# Patient Record
Sex: Female | Born: 1945 | Race: White | Hispanic: No | Marital: Single
Health system: Southern US, Community
[De-identification: ages and names within clinical notes are randomized; demographics above are authoritative.]

---

## 2004-10-10 ENCOUNTER — Ambulatory Visit: Payer: Self-pay | Admitting: Internal Medicine

## 2005-08-13 ENCOUNTER — Ambulatory Visit: Payer: Self-pay | Admitting: Internal Medicine

## 2005-09-15 ENCOUNTER — Ambulatory Visit: Payer: Self-pay | Admitting: Internal Medicine

## 2005-10-14 ENCOUNTER — Ambulatory Visit: Payer: Self-pay | Admitting: Internal Medicine

## 2006-08-12 ENCOUNTER — Ambulatory Visit: Payer: Self-pay | Admitting: Unknown Physician Specialty

## 2006-09-17 ENCOUNTER — Ambulatory Visit: Payer: Self-pay | Admitting: General Surgery

## 2006-10-28 ENCOUNTER — Ambulatory Visit: Payer: Self-pay | Admitting: Internal Medicine

## 2007-11-17 ENCOUNTER — Ambulatory Visit: Payer: Self-pay | Admitting: Internal Medicine

## 2007-11-23 ENCOUNTER — Ambulatory Visit: Payer: Self-pay | Admitting: Internal Medicine

## 2008-05-29 ENCOUNTER — Ambulatory Visit: Payer: Self-pay | Admitting: Internal Medicine

## 2008-12-06 ENCOUNTER — Ambulatory Visit: Payer: Self-pay | Admitting: Internal Medicine

## 2009-12-07 ENCOUNTER — Ambulatory Visit: Payer: Self-pay | Admitting: Internal Medicine

## 2011-01-14 ENCOUNTER — Ambulatory Visit: Payer: Self-pay | Admitting: Internal Medicine

## 2011-08-20 ENCOUNTER — Ambulatory Visit: Payer: Self-pay | Admitting: Unknown Physician Specialty

## 2011-09-26 ENCOUNTER — Ambulatory Visit: Payer: Self-pay | Admitting: Unknown Physician Specialty

## 2011-09-26 DIAGNOSIS — I10 Essential (primary) hypertension: Secondary | ICD-10-CM

## 2011-09-26 LAB — CBC
HCT: 35.8 % (ref 35.0–47.0)
HGB: 11.9 g/dL — ABNORMAL LOW (ref 12.0–16.0)
MCH: 31.4 pg (ref 26.0–34.0)
MCV: 95 fL (ref 80–100)
Platelet: 204 10*3/uL (ref 150–440)
RBC: 3.78 10*6/uL — ABNORMAL LOW (ref 3.80–5.20)

## 2011-09-26 LAB — APTT: Activated PTT: 28.8 secs (ref 23.6–35.9)

## 2011-09-26 LAB — URINALYSIS, COMPLETE
Bilirubin,UR: NEGATIVE
Blood: NEGATIVE
Glucose,UR: NEGATIVE mg/dL (ref 0–75)
Ketone: NEGATIVE
Nitrite: NEGATIVE
Protein: NEGATIVE
RBC,UR: 2 /HPF (ref 0–5)
WBC UR: 18 /HPF (ref 0–5)

## 2011-09-26 LAB — BASIC METABOLIC PANEL
Anion Gap: 10 (ref 7–16)
BUN: 22 mg/dL — ABNORMAL HIGH (ref 7–18)
Co2: 27 mmol/L (ref 21–32)
EGFR (African American): >60
Osmolality: 285 (ref 275–301)
Sodium: 141 mmol/L (ref 136–145)

## 2011-09-26 LAB — PROTIME-INR: Prothrombin Time: 13.2 secs (ref 11.5–14.7)

## 2011-10-08 ENCOUNTER — Inpatient Hospital Stay: Payer: Self-pay | Admitting: Unknown Physician Specialty

## 2011-10-08 LAB — ELECTROLYTE PANEL
Co2: 28 mmol/L (ref 21–32)
Potassium: 3.6 mmol/L (ref 3.5–5.1)
Sodium: 139 mmol/L (ref 136–145)

## 2011-10-08 LAB — HEMOGLOBIN: HGB: 10.4 g/dL — ABNORMAL LOW (ref 12.0–16.0)

## 2011-10-09 LAB — BASIC METABOLIC PANEL
Anion Gap: 6 — ABNORMAL LOW (ref 7–16)
BUN: 12 mg/dL (ref 7–18)
Calcium, Total: 8.2 mg/dL — ABNORMAL LOW (ref 8.5–10.1)
EGFR (African American): >60
EGFR (Non-African Amer.): >60
Glucose: 129 mg/dL — ABNORMAL HIGH (ref 65–99)
Potassium: 3.9 mmol/L (ref 3.5–5.1)
Sodium: 134 mmol/L — ABNORMAL LOW (ref 136–145)

## 2011-10-09 LAB — HEMOGLOBIN: HGB: 8.5 g/dL — ABNORMAL LOW (ref 12.0–16.0)

## 2011-11-05 ENCOUNTER — Ambulatory Visit: Payer: Self-pay | Admitting: Unknown Physician Specialty

## 2012-01-22 ENCOUNTER — Ambulatory Visit: Payer: Self-pay | Admitting: Internal Medicine

## 2012-09-06 ENCOUNTER — Ambulatory Visit: Payer: Self-pay | Admitting: Internal Medicine

## 2013-01-31 ENCOUNTER — Ambulatory Visit: Payer: Self-pay | Admitting: Internal Medicine

## 2013-04-27 IMAGING — MG MM CAD SCREENING MAMMO
1 series · 4 of 4 positions shown · non-contrast
Comparison: none

REASON FOR EXAM: SCR MAMMO NO ORDER
COMMENTS:

PROCEDURE:     MAM - MAM DGTL SCRN MAM NO ORDER W/CAD  - January 22, 2012  [DATE]
RESULT:     COMPARISON:  01/14/2011, 12/07/2009, 06/22/2002
TECHNIQUE: Digital screening mammograms were obtained. FDA approved
computer-aided detection (CAD) for mammography was utilized for this study.

[R CC · right · 4 of 4 slices shown]
[im 1/4]
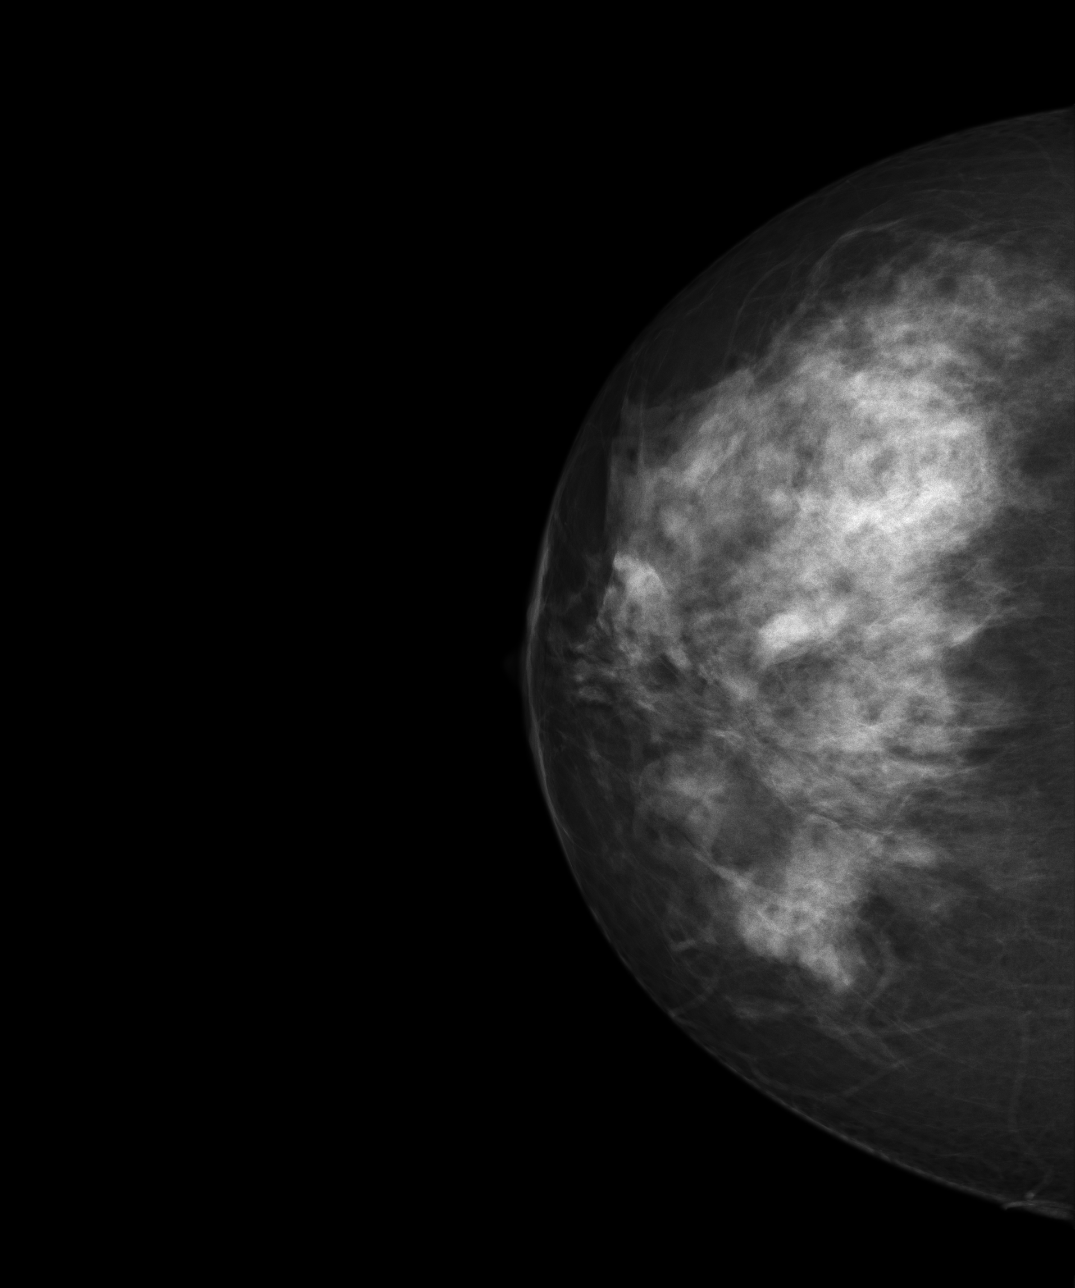
[im 2/4]
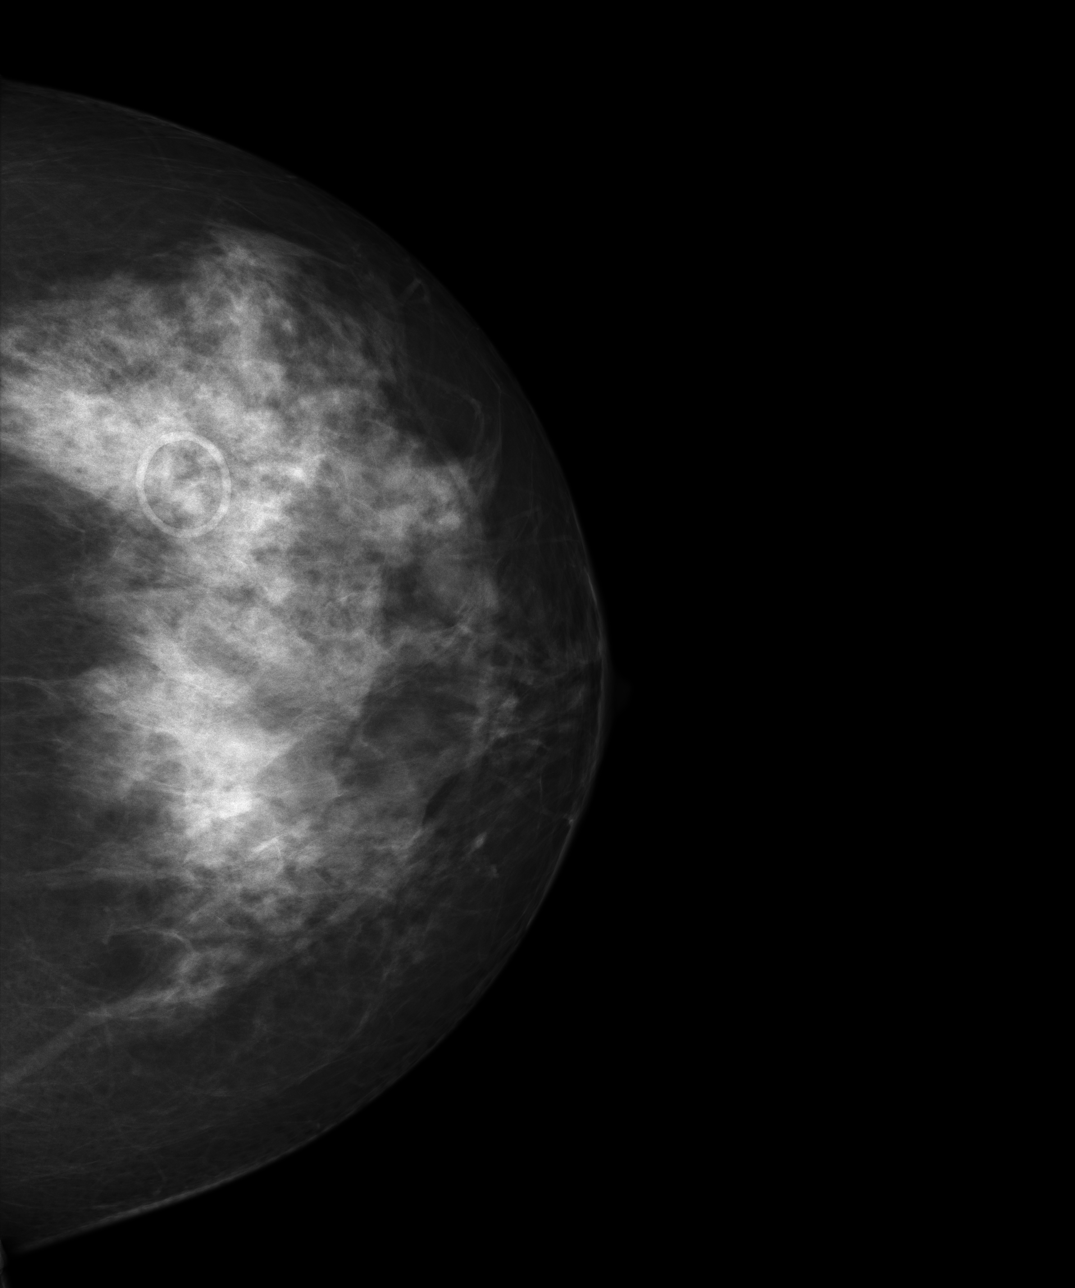
[im 3/4]
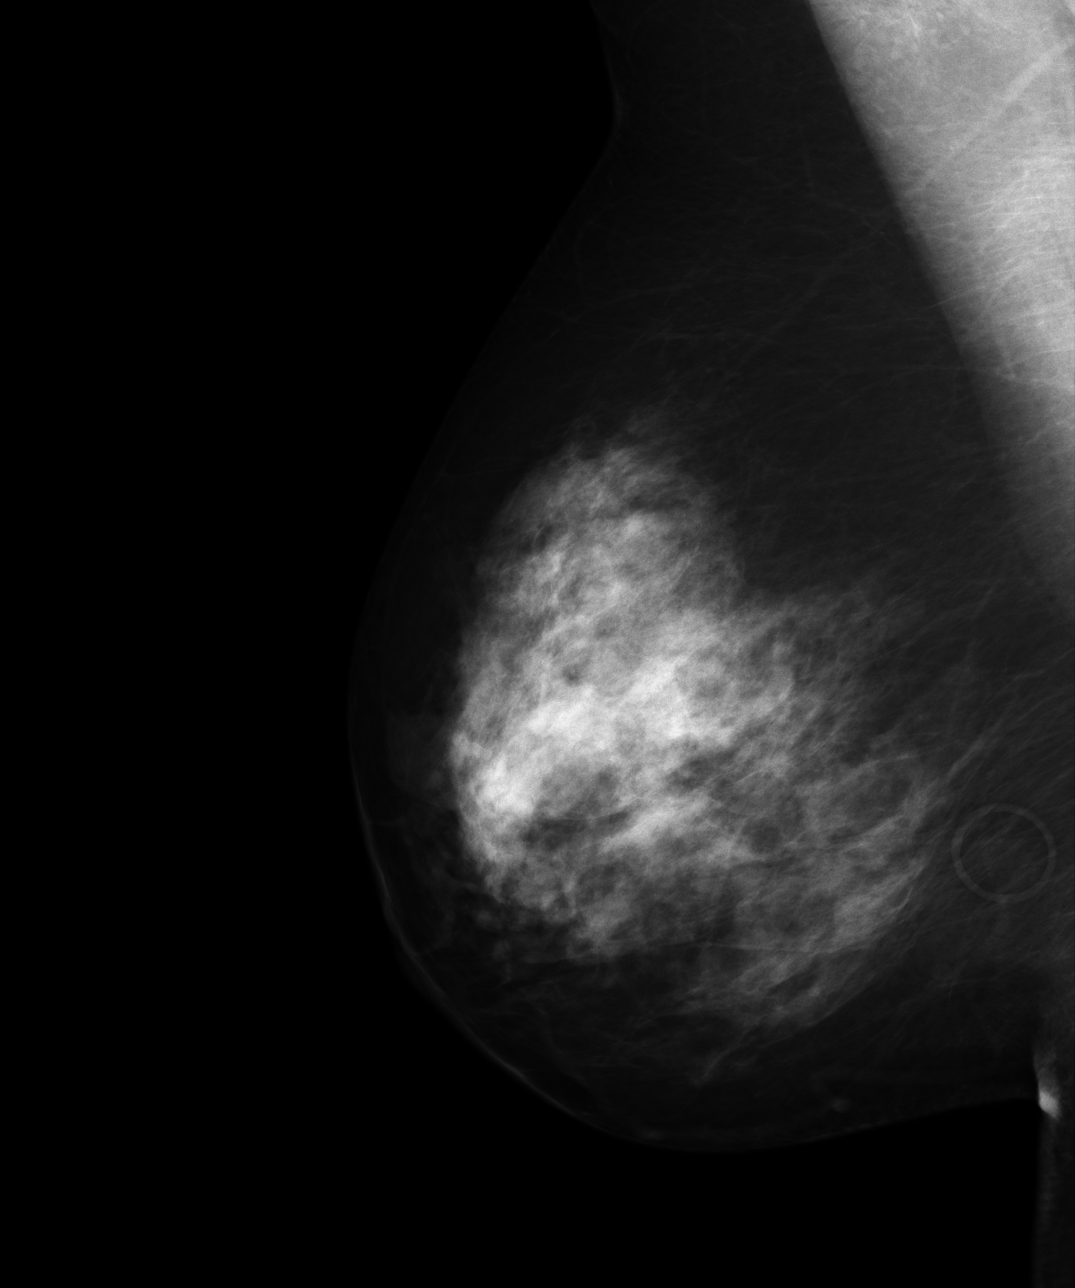
[im 4/4]
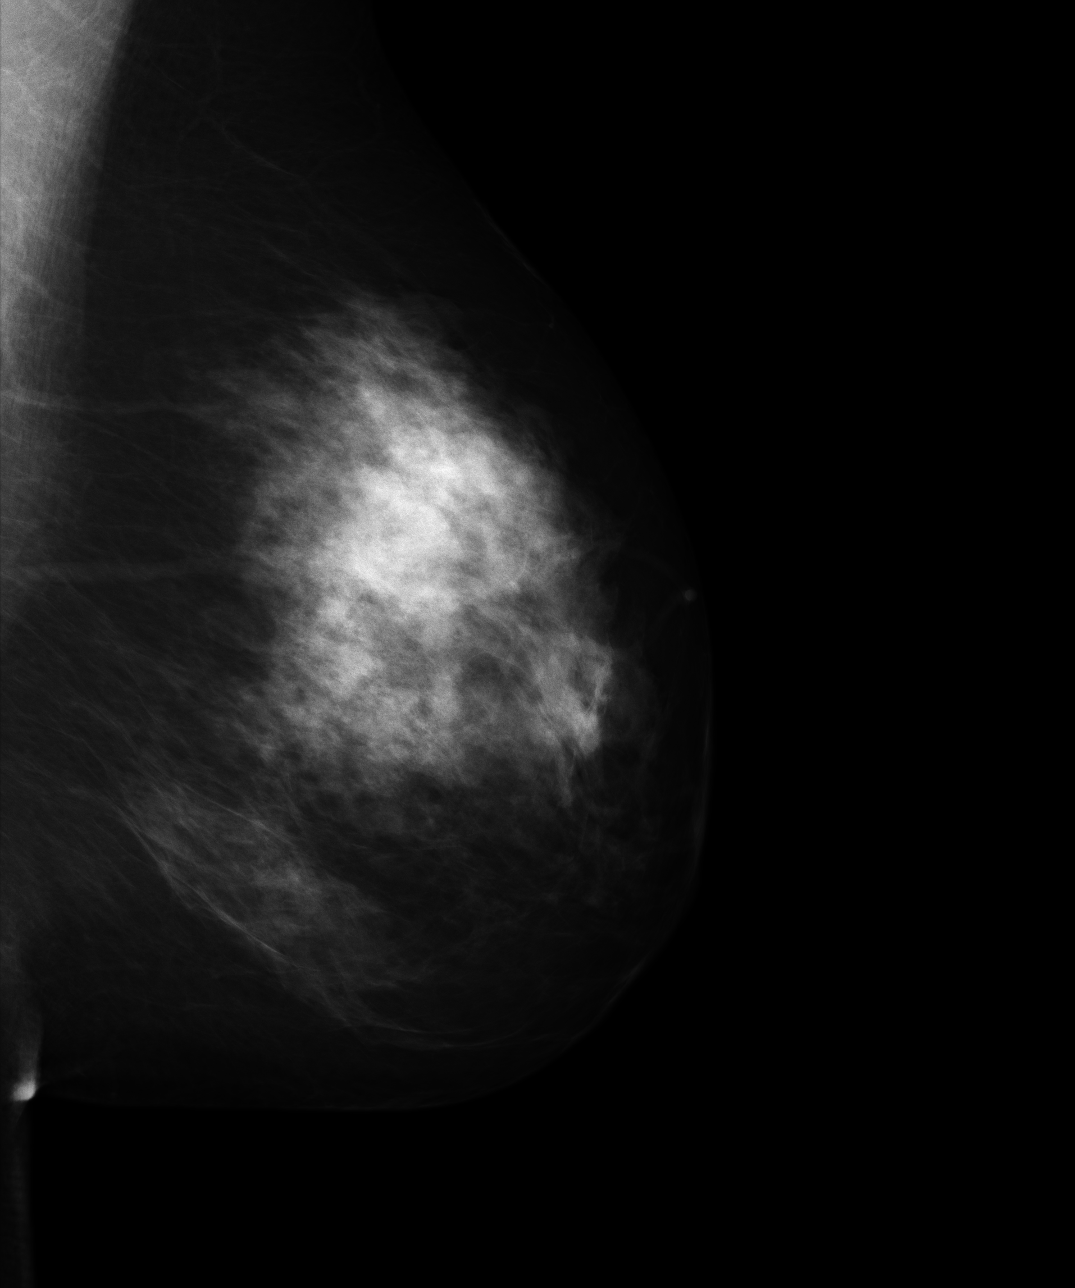

[4 of 4 positions shown; findings below may reference images not displayed]

FINDING: Bilateral breasts are heterogeneously dense which may lower the sensitivity
of mammography.  There is no dominant mass, architectural distortion or
clusters of suspicious microcalcifications.
IMPRESSION: 1.     Stable bilateral mammogram.
2.     Annual mammographic follow up recommended.
3.     BI-RADS:  Category 2- Benign.

A negative mammogram report does not preclude biopsy or other evaluation of
a clinically palpable or otherwise suspicious mass or lesion. Breast cancer
may not be detected by mammography in up to 10% of cases.

[REDACTED]

## 2014-03-14 ENCOUNTER — Ambulatory Visit: Payer: Self-pay | Admitting: Internal Medicine

## 2014-10-01 NOTE — Discharge Summary (Signed)
PATIENT NAME:  Natalie Gutierrez, Natalie Gutierrez MR#:  [id number] DATE OF BIRTH:  1946/01/07  DATE OF ADMISSION:  10/08/2011 DATE OF DISCHARGE:  10/12/2011  ADMITTING DIAGNOSIS: Severe degenerative arthritis right knee.   DISCHARGE DIAGNOSIS: Severe degenerative arthritis right knee.   PROCEDURE: Stryker PS total knee replacement on the right on 10/08/211.    SURGEON: Kathrene Alu., M.D.   ANESTHESIA: Femoral nerve block plus spinal.   HISTORY: Patient had a long history of degenerative arthritis of the right knee. She had failed conservative treatment in the form of intra-articular steroid injections. She also failed Synvisc injection. The patient was ultimately brought in for a total knee replacement due to her refractory to the above treatment.   PHYSICAL EXAMINATION: GENERAL: Well-developed, well-nourished female, no apparent distress. HEENT: Head normocephalic, atraumatic. She does wear glasses. Sclerae clear. Auditory acuity is good. Nares are patent. She has false teeth upper and lower. HEART: S1, S2, regular rate and rhythm. LUNGS: Posterior fields clear to auscultation. No rhonchi or crackles noted. RIGHT KNEE: Range of motion is roughly 15 to 105 degrees. Collateral ligaments are stable to stress testing. Her anterior skin is intact but a scar is seen from previous right knee surgery when she was a child. Homans sign is negative. Distally she is able to move her ankle quite well.   HOSPITAL COURSE: Patient was admitted to the hospital on 10/08/2011. The patient had surgery that same day. The patient had good pain control afterwards. She was nauseated, nausea medications were changed to Reglan. Patient did well with Reglan. The patient's labs remained stable. On postoperative day two hemoglobin trended up to 10.2. The patient received no transfusion. On 10/12/2011 the patient was stable and ready to go home with home health physical therapy.   CONDITION AT DISCHARGE: Stable.   DISPOSITION:  The patient was sent home with home physical therapy.   DISCHARGE INSTRUCTIONS:  1. Patient may gradually increase weight bearing on the affected extremity. Elevate the affected foot/leg on 1 or 2 pillows with the foot higher than the knee. Thigh-high TED hose on both legs and remove at bedtime, replace on arising the next morning.  2. Patient may resume a regular diet as tolerated. No concentrated sweets or sugar. Multivitamin once a day.  3. Resume typical home medications: Celebrex 200 mg twice a day, Tylenol 650 to 1000 mg every six hours as needed for pain, Ultram 1 to 2 tablets every six hours as needed for pain.  4. Continue using the Polar Care unit, maintaining the temperature between 40 and 50 degrees Fahrenheit.  5. Do not get the dressing, bandage wet or dirty. Call [facility] orthopedics if the dressing gets water under it. Leave the dressing on.  6. Patient should call [facility] orthopedics if there is any bright red bleeding from the incision wound, fever above 101.5 degrees, redness swelling or drainage at the incision site. Patient should also call [facility] orthopedics if develop any pain, numbness or weakness in the legs, or bowel or bladder symptoms.  7. Patient is to call [facility] orthopedics for follow-up appointment in two weeks.   DISCHARGE MEDICATIONS: Patient is to continue with her home medications and she will also take:  1. Tylenol [phone] mg every six hours as needed for pain. 2. Ultram 1 to 2 tablets every six hours as needed for pain. 3. Celebrex 200 mg twice a day.  ____________________________ Duanne Guess, PA-C tcg:cms D: 10/14/2011 13:08:41 ET T: 10/15/2011 10:14:04 ET  JOB#: [id number]  cc: Duanne Guess, PA-C, <Dictator> Duanne Guess [place] ELECTRONICALLY SIGNED 10/18/2011 2:49

## 2014-10-01 NOTE — Op Note (Signed)
PATIENT NAME:  Natalie Gutierrez, Natalie Gutierrez MR#:  [id number] DATE OF BIRTH:  [birth year]  DATE OF PROCEDURE:  11/05/2011  PREOPERATIVE DIAGNOSIS: Status post right total knee replacement with arthrofibrosis.   POSTOPERATIVE DIAGNOSIS: Status post right total knee replacement with arthrofibrosis.   PROCEDURE PERFORMED: Manipulation right knee under anesthesia.   SURGEON: Kathrene Alu., MD  ANESTHESIA: General.   HISTORY: The patient had a total knee replacement about 3-4 weeks prior to her manipulation. She had progressed slowly in physical therapy. She had about 20 degrees flexion deformity with further active and passive flexion about 95 degrees. Because of the patient's poor progress with physical therapy she was brought in for manipulation of her right knee under anesthesia.   DESCRIPTION OF PROCEDURE: Patient taken to the Operating Room where satisfactory general anesthesia was achieved. I went ahead and manipulated the right knee into almost full extension and was able to achieve about 105 to 110 degrees of flexion.   At the conclusion of the manipulation the patient was awakened. Ice pack was applied to her right knee and then she was transferred to her stretcher bed and taken to the recovery room in satisfactory condition.   ____________________________ Kathrene Alu., MD hbk:cms D: 11/05/2011 09:16:48 ET T: 11/05/2011 12:15:38 ET JOB#: [id number]  cc: Kathrene Alu., MD, <Dictator> Kathrene Alu MD ELECTRONICALLY SIGNED 11/17/2011 18:57

## 2014-10-01 NOTE — Op Note (Signed)
PATIENT NAME:  Natalie Gutierrez, Natalie Gutierrez MR#:  [id number] DATE OF BIRTH:  [birth year]  DATE OF PROCEDURE:  10/08/2011  PROCEDURE: Right femoral nerve block.   INDICATION: To help this patient with postoperative pain about to have a right total knee arthroplasty by Dr. Leanor Kail.   ATTENDING: Felix Meras P. Phineas Douglas, MD   DESCRIPTION OF PROCEDURE: The risks and benefits of spinal anesthesia, general anesthesia, and the femoral nerve block were discussed with the patient and her family in Same Day Surgery preoperatively. She elected to proceed with the spinal anesthetic with the right femoral nerve block. She does realize that if the surgery takes a prolonged period of time and her block is wearing off that she may need to have general anesthesia as well. Consent was obtained.   The patient was brought to the PAC-U preoperatively. The usual monitors were applied and she was placed on nasal cannula oxygen. She was lightly sedated with a total of 3 mg of Versed intravenously. She was still awake, talkative, and in good verbal communication with [place] but much more comfortable. She had a Betadine prep of her right upper anterior thigh and groin area x3. A sterile technique was used. A 22-gauge 2-inch Stimuplex needle with 0.7 mA of output was used. The usual landmarks were used. The needle was advanced approximately 3/4-inch lateral to the right femoral pulsation. There was good right thigh movement. There was no heme or paresthesias. She had a total of 30 mL of 0.25% bupivacaine with 1:400,000 of epinephrine injected incrementally. She tolerated the block without problem or complication. I am hopeful that the block will help her significantly with postoperative pain for the duration of the block.   ____________________________ Werner Lean. Phineas Douglas, MD spp:drc D: 10/08/2011 07:40:43 ET T: 10/08/2011 08:15:07 ET JOB#: [id number]  cc: Nicki Reaper P. Phineas Douglas, MD, <Dictator> Lucilla Edin MD ELECTRONICALLY SIGNED 10/08/2011 8:51

## 2014-10-01 NOTE — Op Note (Signed)
PATIENT NAME:  Natalie Gutierrez, Natalie Gutierrez MR#:  [id number] DATE OF BIRTH:  Nov 27, 1945  DATE OF PROCEDURE:  10/08/2011  PREOPERATIVE DIAGNOSIS: Severe degenerative arthritis, right knee.   POSTOPERATIVE DIAGNOSIS: Severe degenerative arthritis, right knee.   OPERATION: Stryker PS total knee replacement on the right.   SURGEON: Kathrene Alu., MD   ANESTHESIA: Femoral nerve block plus spinal.   HISTORY: The patient had a long history of degenerative arthritis of the right knee. She had failed conservative treatment in the form of intra-articular steroid injections and Synvisc One injection. The patient was ultimately brought in for a total knee replacement due to her failure to respond to the above treatment.   DESCRIPTION OF PROCEDURE: The patient was taken to the preoperative area where satisfactory femoral nerve block was achieved, and then she was taken the Operating Room where satisfactory general anesthesia was achieved. A Foley was inserted. A tourniquet was applied to her right upper thigh, and the right lower extremity was prepped and draped in the usual fashion for a procedure about the knee. The patient, incidentally, was given 2 grams of Kefzol IV prior to the start of the procedure. The right lower extremity was then exsanguinated and the tourniquet was inflated.   A slightly curved longitudinal incision was made over the anterior aspect of the right knee joint. Dissection was carried down through the subcutaneous tissue onto the extensor mechanism. The vastus medialis was divided about a fingerbreadth above the superomedial border of the patella. It was divided in line with its fibers, and dissection was carried along the medial border of the patellar and the medial border of the patellar tendon. The patella was then everted and dislocated laterally. Inspection of the joint revealed the patient had rather significant degenerative arthritis of her knee that involved both condyles. There  were significant osteophytes about the distal femur and in the intercondylar notch and around the patella. The osteophytes were removed. I went ahead and made a 3/8th inch hole in the trochlear groove about a centimeter anterior to the posterior cruciate ligament insertion on the distal femur. An intramedullary rod was inserted. On the distal end of the rod was  the alignment jig along with the cutting block. After the alignment jig was put in position, the cutting block was affixed to the distal femur with smooth pins. The intramedullary rod and alignment jig were removed. We did use a +2 setting on the femur to remove a little extra bone because the patient did have a flexion deformity.   After the distal femoral cut was made, we templated the distal femur for a #7 femoral component. The appropriate starting holes for a #7 cutting block were made. They were placed in slight exit external rotation. A #7 cutting block was impacted onto the distal femur, and then anterior and posterior cuts were made along with the anterior and posterior chamfer cuts. I notched out the intercondylar area using the appropriate jig. The anterior cruciate ligament and posterior cruciate ligament were removed at this time.   The knee was hyperflexed. Meniscal remnants were excised. A 3/8-inch hole was made in the midline of the proximal tibia at the junction of the anterior one third and posterior two thirds. The intramedullary rod was inserted. On the proximal end of the rod was a 0-degree sloped cutting block. We set the cutting block for a 4 mm cut below the lowest portion of her her medial tibial plateau. The cutting block was aligned and affixed to  the proximal tibia with smooth pins. The intramedullary rod and stylus were removed. The proximal tibial cut was made without difficulty.   At this time, I did use an elevator to slightly elevate the posterior capsule from its attachment to the distal femur. I then inserted the  trial components. I used a #5 standard tibial tray along with the #7 femoral component and a 10 mm polyethylene spacer. With these components in place, the knee came to full extension and seemed to flex well. It seemed to be stable.   I went ahead and released the tourniquet at this time. It was up a little more than 60 minutes. While the tourniquet was down, I went ahead and removed about 10 mm of bone from the retropatellar surface. We templated the retropatellar surface for a #5 resurfacing patellar component. We used a 10 mm thick trial. We made holes for the trial. With the trial in place, the patella seemed to track well.   After the tourniquet had been down about 10 minutes, the right lower extremity was re- exsanguinated and the tourniquet was inflated. The trial components were removed except for the tibial baseplate. We used the tibial baseplate as a guide for our cruciate cut in the proximal tibia. After the cruciate cut was made, we used jet lavage to remove blood from the cut cancellous surfaces. The permanent components were impacted.  The #5 tibial baseplate was impacted onto the proximal tibia with methylmethacrylate. The #7 PS femoral component was impacted onto the distal femur with methylmethacrylate. Excess glue was removed. Then I inserted a 10 mm trial spacer and extended the knee. The #5 10 mm resurfacing patellar component was glued to the retropatellar surface. Again, excess glue was removed.  After the glue had set up, I removed the trial 10 mm tibial bearing insert and inserted the permanent #5 10-mm PS tibial-bearing insert onto the tibial baseplate. Again the knee moved well and seemed to be stable. The tourniquet was released. It was up about 54 minutes the second time.   Bleeding was controlled with coagulation cautery. The wound was irrigated with GU irrigant. Two Autovac tubes were inserted in the depths of the wound. The extensor mechanism was closed with #1 Ethibond and #1  Vicryl sutures. The subcutaneous was closed with 2-0 Vicryl and the skin with skin staples. Betadine was applied to the wound followed by a sterile dressing. I did apply four TENS pads about the incision. A Polar Care cooling pad was applied around the right knee, and a knee immobilizer was applied over this.   The patient was then transferred to a stretcher bed and taken to the recovery room in satisfactory condition.   ESTIMATED BLOOD LOSS: About 250 mL.   ____________________________ Kathrene Alu., MD hbk:cbb D: 10/11/2011 14:38:58 ET T: 10/11/2011 15:09:26 ET JOB#: [id number]  cc: Kathrene Alu., MD, <Dictator> Vilinda Flake, Brooke Bonito MD ELECTRONICALLY SIGNED 10/29/2011 18:54

## 2015-03-07 ENCOUNTER — Other Ambulatory Visit: Payer: Self-pay | Admitting: Internal Medicine

## 2015-03-07 DIAGNOSIS — Z1231 Encounter for screening mammogram for malignant neoplasm of breast: Secondary | ICD-10-CM

## 2015-03-16 ENCOUNTER — Ambulatory Visit
Admission: RE | Admit: 2015-03-16 | Discharge: 2015-03-16 | Disposition: A | Discharge status: Home / Self Care | Payer: Medicare Other | Source: Ambulatory Visit | Attending: Internal Medicine | Admitting: Internal Medicine

## 2015-03-16 DIAGNOSIS — Z1231 Encounter for screening mammogram for malignant neoplasm of breast: Secondary | ICD-10-CM | POA: Diagnosis present

## 2015-06-25 ENCOUNTER — Other Ambulatory Visit: Payer: Self-pay | Admitting: Rheumatology

## 2015-06-25 DIAGNOSIS — M79605 Pain in left leg: Principal | ICD-10-CM

## 2015-06-25 DIAGNOSIS — M79604 Pain in right leg: Secondary | ICD-10-CM

## 2015-07-13 ENCOUNTER — Ambulatory Visit
Admission: RE | Admit: 2015-07-13 | Discharge: 2015-07-13 | Disposition: A | Discharge status: Home / Self Care | Payer: Medicare Other | Source: Ambulatory Visit | Attending: Rheumatology | Admitting: Rheumatology

## 2015-07-13 DIAGNOSIS — M5116 Intervertebral disc disorders with radiculopathy, lumbar region: Secondary | ICD-10-CM | POA: Insufficient documentation

## 2015-07-13 DIAGNOSIS — M79604 Pain in right leg: Secondary | ICD-10-CM

## 2015-07-13 DIAGNOSIS — D1803 Hemangioma of intra-abdominal structures: Secondary | ICD-10-CM | POA: Diagnosis not present

## 2015-07-13 DIAGNOSIS — M79605 Pain in left leg: Secondary | ICD-10-CM | POA: Insufficient documentation

## 2015-07-13 DIAGNOSIS — M545 Low back pain: Secondary | ICD-10-CM | POA: Diagnosis not present

## 2016-03-20 ENCOUNTER — Other Ambulatory Visit: Payer: Self-pay | Admitting: Neurology

## 2016-03-20 DIAGNOSIS — R29810 Facial weakness: Secondary | ICD-10-CM

## 2016-03-20 DIAGNOSIS — R251 Tremor, unspecified: Secondary | ICD-10-CM

## 2016-03-27 ENCOUNTER — Other Ambulatory Visit: Payer: Self-pay | Admitting: Neurology

## 2016-03-31 ENCOUNTER — Other Ambulatory Visit: Payer: Self-pay | Admitting: Internal Medicine

## 2016-03-31 DIAGNOSIS — Z1231 Encounter for screening mammogram for malignant neoplasm of breast: Secondary | ICD-10-CM

## 2016-04-02 ENCOUNTER — Ambulatory Visit: Payer: Medicare Other

## 2016-04-04 ENCOUNTER — Ambulatory Visit
Admission: RE | Admit: 2016-04-04 | Discharge: 2016-04-04 | Disposition: A | Discharge status: Home / Self Care | Payer: Medicare Other | Source: Ambulatory Visit | Attending: Neurology | Admitting: Neurology

## 2016-04-04 DIAGNOSIS — R29810 Facial weakness: Secondary | ICD-10-CM | POA: Insufficient documentation

## 2016-04-04 DIAGNOSIS — R600 Localized edema: Secondary | ICD-10-CM | POA: Diagnosis not present

## 2016-04-04 DIAGNOSIS — R251 Tremor, unspecified: Secondary | ICD-10-CM

## 2016-04-25 ENCOUNTER — Ambulatory Visit
Admission: RE | Admit: 2016-04-25 | Discharge: 2016-04-25 | Disposition: A | Discharge status: Home / Self Care | Payer: Medicare Other | Source: Ambulatory Visit | Attending: Internal Medicine | Admitting: Internal Medicine

## 2016-04-25 DIAGNOSIS — Z1231 Encounter for screening mammogram for malignant neoplasm of breast: Secondary | ICD-10-CM | POA: Diagnosis present

## 2017-03-04 ENCOUNTER — Other Ambulatory Visit: Payer: Self-pay | Admitting: Internal Medicine

## 2017-03-04 DIAGNOSIS — Z1231 Encounter for screening mammogram for malignant neoplasm of breast: Secondary | ICD-10-CM

## 2017-05-08 ENCOUNTER — Ambulatory Visit
Admission: RE | Admit: 2017-05-08 | Discharge: 2017-05-08 | Disposition: A | Discharge status: Home / Self Care | Payer: Medicare Other | Source: Ambulatory Visit | Attending: Internal Medicine | Admitting: Internal Medicine

## 2017-05-08 DIAGNOSIS — Z1231 Encounter for screening mammogram for malignant neoplasm of breast: Secondary | ICD-10-CM | POA: Insufficient documentation

## 2018-01-07 ENCOUNTER — Other Ambulatory Visit: Payer: Self-pay | Admitting: Internal Medicine

## 2018-01-07 DIAGNOSIS — Z1231 Encounter for screening mammogram for malignant neoplasm of breast: Secondary | ICD-10-CM

## 2018-05-14 ENCOUNTER — Ambulatory Visit
Admission: RE | Admit: 2018-05-14 | Discharge: 2018-05-14 | Disposition: A | Discharge status: Home / Self Care | Payer: Medicare Other | Source: Ambulatory Visit | Attending: Internal Medicine | Admitting: Internal Medicine

## 2018-05-14 DIAGNOSIS — Z1231 Encounter for screening mammogram for malignant neoplasm of breast: Secondary | ICD-10-CM | POA: Insufficient documentation

## 2018-06-17 DIAGNOSIS — M5136 Other intervertebral disc degeneration, lumbar region: Secondary | ICD-10-CM | POA: Diagnosis not present

## 2018-06-17 DIAGNOSIS — M9903 Segmental and somatic dysfunction of lumbar region: Secondary | ICD-10-CM | POA: Diagnosis not present

## 2018-06-17 DIAGNOSIS — M5412 Radiculopathy, cervical region: Secondary | ICD-10-CM | POA: Diagnosis not present

## 2018-06-17 DIAGNOSIS — M9901 Segmental and somatic dysfunction of cervical region: Secondary | ICD-10-CM | POA: Diagnosis not present

## 2018-06-25 DIAGNOSIS — I10 Essential (primary) hypertension: Secondary | ICD-10-CM | POA: Diagnosis not present

## 2018-07-02 DIAGNOSIS — E039 Hypothyroidism, unspecified: Secondary | ICD-10-CM | POA: Diagnosis not present

## 2018-07-02 DIAGNOSIS — E559 Vitamin D deficiency, unspecified: Secondary | ICD-10-CM | POA: Diagnosis not present

## 2018-07-02 DIAGNOSIS — E7800 Pure hypercholesterolemia, unspecified: Secondary | ICD-10-CM | POA: Diagnosis not present

## 2018-07-02 DIAGNOSIS — I10 Essential (primary) hypertension: Secondary | ICD-10-CM | POA: Diagnosis not present

## 2018-07-02 DIAGNOSIS — E538 Deficiency of other specified B group vitamins: Secondary | ICD-10-CM | POA: Diagnosis not present

## 2018-07-02 DIAGNOSIS — R05 Cough: Secondary | ICD-10-CM | POA: Diagnosis not present

## 2018-07-02 DIAGNOSIS — N183 Chronic kidney disease, stage 3 (moderate): Secondary | ICD-10-CM | POA: Diagnosis not present

## 2018-07-02 DIAGNOSIS — Z0000 Encounter for general adult medical examination without abnormal findings: Secondary | ICD-10-CM | POA: Diagnosis not present

## 2018-07-06 ENCOUNTER — Other Ambulatory Visit: Payer: Self-pay | Admitting: Internal Medicine

## 2018-07-06 DIAGNOSIS — N183 Chronic kidney disease, stage 3 unspecified: Secondary | ICD-10-CM

## 2018-07-09 ENCOUNTER — Ambulatory Visit
Admission: RE | Admit: 2018-07-09 | Discharge: 2018-07-09 | Disposition: A | Discharge status: Home / Self Care | Payer: PPO | Source: Ambulatory Visit | Attending: Internal Medicine | Admitting: Internal Medicine

## 2018-07-09 DIAGNOSIS — N183 Chronic kidney disease, stage 3 unspecified: Secondary | ICD-10-CM

## 2018-07-15 DIAGNOSIS — M9901 Segmental and somatic dysfunction of cervical region: Secondary | ICD-10-CM | POA: Diagnosis not present

## 2018-07-15 DIAGNOSIS — M9903 Segmental and somatic dysfunction of lumbar region: Secondary | ICD-10-CM | POA: Diagnosis not present

## 2018-07-15 DIAGNOSIS — M5412 Radiculopathy, cervical region: Secondary | ICD-10-CM | POA: Diagnosis not present

## 2018-07-15 DIAGNOSIS — M5136 Other intervertebral disc degeneration, lumbar region: Secondary | ICD-10-CM | POA: Diagnosis not present

## 2018-08-12 DIAGNOSIS — M5412 Radiculopathy, cervical region: Secondary | ICD-10-CM | POA: Diagnosis not present

## 2018-08-12 DIAGNOSIS — M9901 Segmental and somatic dysfunction of cervical region: Secondary | ICD-10-CM | POA: Diagnosis not present

## 2018-08-12 DIAGNOSIS — M5136 Other intervertebral disc degeneration, lumbar region: Secondary | ICD-10-CM | POA: Diagnosis not present

## 2018-08-12 DIAGNOSIS — M9903 Segmental and somatic dysfunction of lumbar region: Secondary | ICD-10-CM | POA: Diagnosis not present

## 2018-08-23 ENCOUNTER — Encounter: Payer: Self-pay | Admitting: *Deleted

## 2018-10-01 DIAGNOSIS — Z79899 Other long term (current) drug therapy: Secondary | ICD-10-CM | POA: Diagnosis not present

## 2018-10-01 DIAGNOSIS — Z1211 Encounter for screening for malignant neoplasm of colon: Secondary | ICD-10-CM | POA: Diagnosis not present

## 2018-10-01 DIAGNOSIS — R8290 Unspecified abnormal findings in urine: Secondary | ICD-10-CM | POA: Diagnosis not present

## 2018-10-01 DIAGNOSIS — E559 Vitamin D deficiency, unspecified: Secondary | ICD-10-CM | POA: Diagnosis not present

## 2018-10-01 DIAGNOSIS — I10 Essential (primary) hypertension: Secondary | ICD-10-CM | POA: Diagnosis not present

## 2018-10-01 DIAGNOSIS — E039 Hypothyroidism, unspecified: Secondary | ICD-10-CM | POA: Diagnosis not present

## 2018-10-01 DIAGNOSIS — E538 Deficiency of other specified B group vitamins: Secondary | ICD-10-CM | POA: Diagnosis not present

## 2018-10-01 DIAGNOSIS — N183 Chronic kidney disease, stage 3 (moderate): Secondary | ICD-10-CM | POA: Diagnosis not present

## 2018-10-01 DIAGNOSIS — E7800 Pure hypercholesterolemia, unspecified: Secondary | ICD-10-CM | POA: Diagnosis not present

## 2018-10-06 DIAGNOSIS — Z1211 Encounter for screening for malignant neoplasm of colon: Secondary | ICD-10-CM | POA: Diagnosis not present

## 2018-10-27 ENCOUNTER — Other Ambulatory Visit: Payer: Self-pay | Admitting: *Deleted

## 2018-10-27 NOTE — Patient Outreach (Signed)
[place] [facility]) Care Management  10/27/2018  Natalie Gutierrez 01/21/1946 [id number]   HTA HRA Telephone Screen     Outreach Attempt:  Successful telephone outreach to patient in the month of March to complete Health Risk Assessment Screening.  HIPAA verified with patient.  Patient reporting history of hypertension and stating blood pressures are well controlled at medical appointments.  Does not monitor blood pressure at home.  Does not have advance directive and requesting information to create one.  Denies any further needs, at this time.   Plan:  Patient will be placed in a [place] with 3 month follow up to verify receipt of Advance Directive information.  RN Health Coach will send [facility] Welcome Letter. RN Health Coach sent Advance Directive Packet and EMMI Advance Directive.  RN Health Coach will make next telephone outreach to patient for follow up in the month of June.  [place] [phone] [email or web address]

## 2018-11-18 ENCOUNTER — Other Ambulatory Visit: Payer: Self-pay | Admitting: *Deleted

## 2018-11-18 NOTE — Patient Outreach (Signed)
[place] [facility]) Care Management  11/18/2018  Brieanna Nau [birth year] [id number]   HTA HRAFollow Up   Outreach Attempt:  Successful telephone outreach to patient for follow up.  HIPAA verified with patient.  Patient verbalizes she has received Advance Directive packet and her daughter is helping her with the packet.  States she understands not to sign paperwork until she is in front of notary.  Patient states she feels her blood pressure is still well controlled. Discussed with patient transition to [facility].  Plan: RN Health Coach will outreach to patient in 6 months if patient has not transitioned to [facility].  [place] [phone] [email or web address]

## 2018-12-01 ENCOUNTER — Other Ambulatory Visit: Payer: Self-pay | Admitting: *Deleted

## 2018-12-01 NOTE — Patient Outreach (Signed)
[place] [facility]) Care Management  12/01/2018  Yulisa Chirico 02-03-1946 [id number]   Case Closure/Transition to [facility]/CCI   Outreach:  Patient case has been transitioned to [facility]/CCI for further Care Management assistance.  Plan: RN Health Coach will close case at this time. RN Health Coach will send primary care provider Care Management Case Closure Letter. RN Health Coach will make patient inactive with [facility] Care Management at this time.  [place] [phone] [email or web address]

## 2018-12-31 DIAGNOSIS — R8290 Unspecified abnormal findings in urine: Secondary | ICD-10-CM | POA: Diagnosis not present

## 2018-12-31 DIAGNOSIS — E039 Hypothyroidism, unspecified: Secondary | ICD-10-CM | POA: Diagnosis not present

## 2018-12-31 DIAGNOSIS — Z79899 Other long term (current) drug therapy: Secondary | ICD-10-CM | POA: Diagnosis not present

## 2018-12-31 DIAGNOSIS — N183 Chronic kidney disease, stage 3 (moderate): Secondary | ICD-10-CM | POA: Diagnosis not present

## 2018-12-31 DIAGNOSIS — I10 Essential (primary) hypertension: Secondary | ICD-10-CM | POA: Diagnosis not present

## 2018-12-31 DIAGNOSIS — R0600 Dyspnea, unspecified: Secondary | ICD-10-CM | POA: Diagnosis not present

## 2018-12-31 DIAGNOSIS — E559 Vitamin D deficiency, unspecified: Secondary | ICD-10-CM | POA: Diagnosis not present

## 2018-12-31 DIAGNOSIS — E7800 Pure hypercholesterolemia, unspecified: Secondary | ICD-10-CM | POA: Diagnosis not present

## 2018-12-31 DIAGNOSIS — R7309 Other abnormal glucose: Secondary | ICD-10-CM | POA: Diagnosis not present

## 2019-01-13 DIAGNOSIS — R0600 Dyspnea, unspecified: Secondary | ICD-10-CM | POA: Diagnosis not present

## 2019-01-13 DIAGNOSIS — I10 Essential (primary) hypertension: Secondary | ICD-10-CM | POA: Diagnosis not present

## 2019-04-05 DIAGNOSIS — N1832 Chronic kidney disease, stage 3b: Secondary | ICD-10-CM | POA: Diagnosis not present

## 2019-04-05 DIAGNOSIS — E559 Vitamin D deficiency, unspecified: Secondary | ICD-10-CM | POA: Diagnosis not present

## 2019-04-05 DIAGNOSIS — E7800 Pure hypercholesterolemia, unspecified: Secondary | ICD-10-CM | POA: Diagnosis not present

## 2019-04-05 DIAGNOSIS — Z1231 Encounter for screening mammogram for malignant neoplasm of breast: Secondary | ICD-10-CM | POA: Diagnosis not present

## 2019-04-05 DIAGNOSIS — I10 Essential (primary) hypertension: Secondary | ICD-10-CM | POA: Diagnosis not present

## 2019-04-05 DIAGNOSIS — E039 Hypothyroidism, unspecified: Secondary | ICD-10-CM | POA: Diagnosis not present

## 2019-04-05 DIAGNOSIS — E538 Deficiency of other specified B group vitamins: Secondary | ICD-10-CM | POA: Diagnosis not present

## 2019-04-05 DIAGNOSIS — Z79899 Other long term (current) drug therapy: Secondary | ICD-10-CM | POA: Diagnosis not present

## 2019-04-05 DIAGNOSIS — R7309 Other abnormal glucose: Secondary | ICD-10-CM | POA: Diagnosis not present

## 2019-04-07 ENCOUNTER — Other Ambulatory Visit: Payer: Self-pay | Admitting: Internal Medicine

## 2019-04-07 DIAGNOSIS — Z1231 Encounter for screening mammogram for malignant neoplasm of breast: Secondary | ICD-10-CM

## 2019-04-13 DIAGNOSIS — R8290 Unspecified abnormal findings in urine: Secondary | ICD-10-CM | POA: Diagnosis not present

## 2019-04-13 DIAGNOSIS — R300 Dysuria: Secondary | ICD-10-CM | POA: Diagnosis not present

## 2019-05-16 ENCOUNTER — Ambulatory Visit
Admission: RE | Admit: 2019-05-16 | Discharge: 2019-05-16 | Disposition: A | Discharge status: Home / Self Care | Payer: PPO | Source: Ambulatory Visit | Attending: Internal Medicine | Admitting: Internal Medicine

## 2019-05-16 DIAGNOSIS — Z1231 Encounter for screening mammogram for malignant neoplasm of breast: Secondary | ICD-10-CM | POA: Insufficient documentation

## 2019-09-02 DIAGNOSIS — Z1211 Encounter for screening for malignant neoplasm of colon: Secondary | ICD-10-CM | POA: Diagnosis not present

## 2019-09-02 DIAGNOSIS — I471 Supraventricular tachycardia: Secondary | ICD-10-CM | POA: Diagnosis not present

## 2019-09-02 DIAGNOSIS — R7309 Other abnormal glucose: Secondary | ICD-10-CM | POA: Diagnosis not present

## 2019-09-02 DIAGNOSIS — Z79899 Other long term (current) drug therapy: Secondary | ICD-10-CM | POA: Diagnosis not present

## 2019-09-02 DIAGNOSIS — Z0000 Encounter for general adult medical examination without abnormal findings: Secondary | ICD-10-CM | POA: Diagnosis not present

## 2019-09-02 DIAGNOSIS — E559 Vitamin D deficiency, unspecified: Secondary | ICD-10-CM | POA: Diagnosis not present

## 2019-09-02 DIAGNOSIS — E538 Deficiency of other specified B group vitamins: Secondary | ICD-10-CM | POA: Diagnosis not present

## 2019-09-02 DIAGNOSIS — E7800 Pure hypercholesterolemia, unspecified: Secondary | ICD-10-CM | POA: Diagnosis not present

## 2019-09-02 DIAGNOSIS — E039 Hypothyroidism, unspecified: Secondary | ICD-10-CM | POA: Diagnosis not present

## 2019-09-02 DIAGNOSIS — N1832 Chronic kidney disease, stage 3b: Secondary | ICD-10-CM | POA: Diagnosis not present

## 2019-09-02 DIAGNOSIS — I10 Essential (primary) hypertension: Secondary | ICD-10-CM | POA: Diagnosis not present

## 2019-09-08 DIAGNOSIS — Z1211 Encounter for screening for malignant neoplasm of colon: Secondary | ICD-10-CM | POA: Diagnosis not present

## 2019-09-08 DIAGNOSIS — R7309 Other abnormal glucose: Secondary | ICD-10-CM | POA: Diagnosis not present

## 2019-09-08 DIAGNOSIS — I10 Essential (primary) hypertension: Secondary | ICD-10-CM | POA: Diagnosis not present

## 2019-09-08 DIAGNOSIS — E559 Vitamin D deficiency, unspecified: Secondary | ICD-10-CM | POA: Diagnosis not present

## 2019-09-08 DIAGNOSIS — I471 Supraventricular tachycardia: Secondary | ICD-10-CM | POA: Diagnosis not present

## 2019-09-08 DIAGNOSIS — E7800 Pure hypercholesterolemia, unspecified: Secondary | ICD-10-CM | POA: Diagnosis not present

## 2019-09-08 DIAGNOSIS — Z0000 Encounter for general adult medical examination without abnormal findings: Secondary | ICD-10-CM | POA: Diagnosis not present

## 2019-09-08 DIAGNOSIS — E538 Deficiency of other specified B group vitamins: Secondary | ICD-10-CM | POA: Diagnosis not present

## 2019-09-08 DIAGNOSIS — Z79899 Other long term (current) drug therapy: Secondary | ICD-10-CM | POA: Diagnosis not present

## 2019-09-08 DIAGNOSIS — N1832 Chronic kidney disease, stage 3b: Secondary | ICD-10-CM | POA: Diagnosis not present

## 2019-09-08 DIAGNOSIS — E039 Hypothyroidism, unspecified: Secondary | ICD-10-CM | POA: Diagnosis not present

## 2019-09-14 DIAGNOSIS — Z79899 Other long term (current) drug therapy: Secondary | ICD-10-CM | POA: Diagnosis not present

## 2019-09-14 DIAGNOSIS — E538 Deficiency of other specified B group vitamins: Secondary | ICD-10-CM | POA: Diagnosis not present

## 2019-09-14 DIAGNOSIS — E559 Vitamin D deficiency, unspecified: Secondary | ICD-10-CM | POA: Diagnosis not present

## 2019-09-14 DIAGNOSIS — I10 Essential (primary) hypertension: Secondary | ICD-10-CM | POA: Diagnosis not present

## 2019-09-14 DIAGNOSIS — R7309 Other abnormal glucose: Secondary | ICD-10-CM | POA: Diagnosis not present

## 2019-09-14 DIAGNOSIS — E7800 Pure hypercholesterolemia, unspecified: Secondary | ICD-10-CM | POA: Diagnosis not present

## 2019-09-14 DIAGNOSIS — E039 Hypothyroidism, unspecified: Secondary | ICD-10-CM | POA: Diagnosis not present

## 2020-03-06 DIAGNOSIS — E538 Deficiency of other specified B group vitamins: Secondary | ICD-10-CM | POA: Diagnosis not present

## 2020-03-06 DIAGNOSIS — E039 Hypothyroidism, unspecified: Secondary | ICD-10-CM | POA: Diagnosis not present

## 2020-03-06 DIAGNOSIS — E559 Vitamin D deficiency, unspecified: Secondary | ICD-10-CM | POA: Diagnosis not present

## 2020-03-06 DIAGNOSIS — R7309 Other abnormal glucose: Secondary | ICD-10-CM | POA: Diagnosis not present

## 2020-03-06 DIAGNOSIS — N1832 Chronic kidney disease, stage 3b: Secondary | ICD-10-CM | POA: Diagnosis not present

## 2020-03-06 DIAGNOSIS — Z79899 Other long term (current) drug therapy: Secondary | ICD-10-CM | POA: Diagnosis not present

## 2020-03-06 DIAGNOSIS — E7800 Pure hypercholesterolemia, unspecified: Secondary | ICD-10-CM | POA: Diagnosis not present

## 2020-03-06 DIAGNOSIS — I10 Essential (primary) hypertension: Secondary | ICD-10-CM | POA: Diagnosis not present

## 2020-03-06 DIAGNOSIS — Z1231 Encounter for screening mammogram for malignant neoplasm of breast: Secondary | ICD-10-CM | POA: Diagnosis not present

## 2020-03-08 ENCOUNTER — Other Ambulatory Visit: Payer: Self-pay | Admitting: Orthopedic Surgery

## 2020-03-08 ENCOUNTER — Other Ambulatory Visit: Payer: Self-pay | Admitting: Internal Medicine

## 2020-03-08 DIAGNOSIS — Z1231 Encounter for screening mammogram for malignant neoplasm of breast: Secondary | ICD-10-CM

## 2020-05-16 ENCOUNTER — Ambulatory Visit
Admission: RE | Admit: 2020-05-16 | Discharge: 2020-05-16 | Disposition: A | Discharge status: Home / Self Care | Payer: PPO | Source: Ambulatory Visit | Attending: Internal Medicine | Admitting: Internal Medicine

## 2020-05-16 ENCOUNTER — Other Ambulatory Visit: Payer: Self-pay

## 2020-05-16 DIAGNOSIS — Z1231 Encounter for screening mammogram for malignant neoplasm of breast: Secondary | ICD-10-CM | POA: Diagnosis not present

## 2020-05-28 DIAGNOSIS — N1832 Chronic kidney disease, stage 3b: Secondary | ICD-10-CM | POA: Diagnosis not present

## 2020-05-28 DIAGNOSIS — N393 Stress incontinence (female) (male): Secondary | ICD-10-CM | POA: Diagnosis not present

## 2020-05-28 DIAGNOSIS — R7309 Other abnormal glucose: Secondary | ICD-10-CM | POA: Diagnosis not present

## 2020-05-28 DIAGNOSIS — E7800 Pure hypercholesterolemia, unspecified: Secondary | ICD-10-CM | POA: Diagnosis not present

## 2020-05-28 DIAGNOSIS — I10 Essential (primary) hypertension: Secondary | ICD-10-CM | POA: Diagnosis not present

## 2020-09-03 DIAGNOSIS — R059 Cough, unspecified: Secondary | ICD-10-CM | POA: Diagnosis not present

## 2020-09-03 DIAGNOSIS — E559 Vitamin D deficiency, unspecified: Secondary | ICD-10-CM | POA: Diagnosis not present

## 2020-09-03 DIAGNOSIS — Z1211 Encounter for screening for malignant neoplasm of colon: Secondary | ICD-10-CM | POA: Diagnosis not present

## 2020-09-03 DIAGNOSIS — I10 Essential (primary) hypertension: Secondary | ICD-10-CM | POA: Diagnosis not present

## 2020-09-03 DIAGNOSIS — R053 Chronic cough: Secondary | ICD-10-CM | POA: Diagnosis not present

## 2020-09-03 DIAGNOSIS — E538 Deficiency of other specified B group vitamins: Secondary | ICD-10-CM | POA: Diagnosis not present

## 2020-09-03 DIAGNOSIS — Z79899 Other long term (current) drug therapy: Secondary | ICD-10-CM | POA: Diagnosis not present

## 2020-09-03 DIAGNOSIS — E039 Hypothyroidism, unspecified: Secondary | ICD-10-CM | POA: Diagnosis not present

## 2020-09-03 DIAGNOSIS — R002 Palpitations: Secondary | ICD-10-CM | POA: Diagnosis not present

## 2020-09-03 DIAGNOSIS — R7309 Other abnormal glucose: Secondary | ICD-10-CM | POA: Diagnosis not present

## 2020-09-03 DIAGNOSIS — Z0000 Encounter for general adult medical examination without abnormal findings: Secondary | ICD-10-CM | POA: Diagnosis not present

## 2020-09-03 DIAGNOSIS — E7800 Pure hypercholesterolemia, unspecified: Secondary | ICD-10-CM | POA: Diagnosis not present

## 2020-09-03 DIAGNOSIS — N1832 Chronic kidney disease, stage 3b: Secondary | ICD-10-CM | POA: Diagnosis not present

## 2020-09-03 DIAGNOSIS — R8290 Unspecified abnormal findings in urine: Secondary | ICD-10-CM | POA: Diagnosis not present

## 2020-09-10 DIAGNOSIS — Z1211 Encounter for screening for malignant neoplasm of colon: Secondary | ICD-10-CM | POA: Diagnosis not present

## 2021-03-04 DIAGNOSIS — R7309 Other abnormal glucose: Secondary | ICD-10-CM | POA: Diagnosis not present

## 2021-03-04 DIAGNOSIS — E559 Vitamin D deficiency, unspecified: Secondary | ICD-10-CM | POA: Diagnosis not present

## 2021-03-04 DIAGNOSIS — N183 Chronic kidney disease, stage 3 unspecified: Secondary | ICD-10-CM | POA: Diagnosis not present

## 2021-03-04 DIAGNOSIS — R531 Weakness: Secondary | ICD-10-CM | POA: Diagnosis not present

## 2021-03-04 DIAGNOSIS — Z79899 Other long term (current) drug therapy: Secondary | ICD-10-CM | POA: Diagnosis not present

## 2021-03-04 DIAGNOSIS — E039 Hypothyroidism, unspecified: Secondary | ICD-10-CM | POA: Diagnosis not present

## 2021-03-04 DIAGNOSIS — E7800 Pure hypercholesterolemia, unspecified: Secondary | ICD-10-CM | POA: Diagnosis not present

## 2021-03-04 DIAGNOSIS — I10 Essential (primary) hypertension: Secondary | ICD-10-CM | POA: Diagnosis not present

## 2021-03-04 DIAGNOSIS — Z1231 Encounter for screening mammogram for malignant neoplasm of breast: Secondary | ICD-10-CM | POA: Diagnosis not present

## 2021-04-08 ENCOUNTER — Other Ambulatory Visit: Payer: Self-pay | Admitting: Internal Medicine

## 2021-04-08 DIAGNOSIS — Z1231 Encounter for screening mammogram for malignant neoplasm of breast: Secondary | ICD-10-CM

## 2021-05-06 DIAGNOSIS — H26492 Other secondary cataract, left eye: Secondary | ICD-10-CM | POA: Diagnosis not present

## 2021-05-09 DIAGNOSIS — H26492 Other secondary cataract, left eye: Secondary | ICD-10-CM | POA: Diagnosis not present

## 2021-05-17 ENCOUNTER — Other Ambulatory Visit: Payer: Self-pay

## 2021-05-17 ENCOUNTER — Ambulatory Visit
Admission: RE | Admit: 2021-05-17 | Discharge: 2021-05-17 | Disposition: A | Discharge status: Home / Self Care | Payer: PPO | Source: Ambulatory Visit | Attending: Internal Medicine | Admitting: Internal Medicine

## 2021-05-17 DIAGNOSIS — Z1231 Encounter for screening mammogram for malignant neoplasm of breast: Secondary | ICD-10-CM | POA: Insufficient documentation

## 2021-06-11 DIAGNOSIS — E039 Hypothyroidism, unspecified: Secondary | ICD-10-CM | POA: Diagnosis not present

## 2021-06-11 DIAGNOSIS — E7800 Pure hypercholesterolemia, unspecified: Secondary | ICD-10-CM | POA: Diagnosis not present

## 2021-06-11 DIAGNOSIS — E559 Vitamin D deficiency, unspecified: Secondary | ICD-10-CM | POA: Diagnosis not present

## 2021-06-11 DIAGNOSIS — I10 Essential (primary) hypertension: Secondary | ICD-10-CM | POA: Diagnosis not present

## 2021-06-11 DIAGNOSIS — Z79899 Other long term (current) drug therapy: Secondary | ICD-10-CM | POA: Diagnosis not present

## 2021-06-11 DIAGNOSIS — R7309 Other abnormal glucose: Secondary | ICD-10-CM | POA: Diagnosis not present

## 2021-09-18 DIAGNOSIS — R7309 Other abnormal glucose: Secondary | ICD-10-CM | POA: Diagnosis not present

## 2021-09-18 DIAGNOSIS — N183 Chronic kidney disease, stage 3 unspecified: Secondary | ICD-10-CM | POA: Diagnosis not present

## 2021-09-18 DIAGNOSIS — E039 Hypothyroidism, unspecified: Secondary | ICD-10-CM | POA: Diagnosis not present

## 2021-09-18 DIAGNOSIS — I10 Essential (primary) hypertension: Secondary | ICD-10-CM | POA: Diagnosis not present

## 2021-09-18 DIAGNOSIS — R002 Palpitations: Secondary | ICD-10-CM | POA: Diagnosis not present

## 2021-09-18 DIAGNOSIS — I471 Supraventricular tachycardia: Secondary | ICD-10-CM | POA: Diagnosis not present

## 2021-09-18 DIAGNOSIS — Z1211 Encounter for screening for malignant neoplasm of colon: Secondary | ICD-10-CM | POA: Diagnosis not present

## 2021-09-18 DIAGNOSIS — E538 Deficiency of other specified B group vitamins: Secondary | ICD-10-CM | POA: Diagnosis not present

## 2021-09-18 DIAGNOSIS — Z0000 Encounter for general adult medical examination without abnormal findings: Secondary | ICD-10-CM | POA: Diagnosis not present

## 2021-09-18 DIAGNOSIS — E782 Mixed hyperlipidemia: Secondary | ICD-10-CM | POA: Diagnosis not present

## 2021-09-18 DIAGNOSIS — Z79899 Other long term (current) drug therapy: Secondary | ICD-10-CM | POA: Diagnosis not present

## 2021-09-18 DIAGNOSIS — E559 Vitamin D deficiency, unspecified: Secondary | ICD-10-CM | POA: Diagnosis not present

## 2021-09-26 DIAGNOSIS — Z1211 Encounter for screening for malignant neoplasm of colon: Secondary | ICD-10-CM | POA: Diagnosis not present

## 2021-12-18 DIAGNOSIS — E039 Hypothyroidism, unspecified: Secondary | ICD-10-CM | POA: Diagnosis not present

## 2021-12-18 DIAGNOSIS — I471 Supraventricular tachycardia: Secondary | ICD-10-CM | POA: Diagnosis not present

## 2021-12-18 DIAGNOSIS — R7309 Other abnormal glucose: Secondary | ICD-10-CM | POA: Diagnosis not present

## 2021-12-18 DIAGNOSIS — E538 Deficiency of other specified B group vitamins: Secondary | ICD-10-CM | POA: Diagnosis not present

## 2021-12-18 DIAGNOSIS — Z79899 Other long term (current) drug therapy: Secondary | ICD-10-CM | POA: Diagnosis not present

## 2021-12-18 DIAGNOSIS — E7800 Pure hypercholesterolemia, unspecified: Secondary | ICD-10-CM | POA: Diagnosis not present

## 2021-12-18 DIAGNOSIS — N183 Chronic kidney disease, stage 3 unspecified: Secondary | ICD-10-CM | POA: Diagnosis not present

## 2021-12-18 DIAGNOSIS — I10 Essential (primary) hypertension: Secondary | ICD-10-CM | POA: Diagnosis not present

## 2021-12-18 DIAGNOSIS — E559 Vitamin D deficiency, unspecified: Secondary | ICD-10-CM | POA: Diagnosis not present

## 2022-03-10 DIAGNOSIS — M85832 Other specified disorders of bone density and structure, left forearm: Secondary | ICD-10-CM | POA: Diagnosis not present

## 2022-03-10 DIAGNOSIS — M25532 Pain in left wrist: Secondary | ICD-10-CM | POA: Diagnosis not present

## 2022-03-10 DIAGNOSIS — M7989 Other specified soft tissue disorders: Secondary | ICD-10-CM | POA: Diagnosis not present

## 2022-03-10 DIAGNOSIS — M11232 Other chondrocalcinosis, left wrist: Secondary | ICD-10-CM | POA: Diagnosis not present

## 2022-03-10 DIAGNOSIS — I10 Essential (primary) hypertension: Secondary | ICD-10-CM | POA: Diagnosis not present

## 2022-03-10 DIAGNOSIS — M19032 Primary osteoarthritis, left wrist: Secondary | ICD-10-CM | POA: Diagnosis not present

## 2022-03-21 DIAGNOSIS — E039 Hypothyroidism, unspecified: Secondary | ICD-10-CM | POA: Diagnosis not present

## 2022-03-21 DIAGNOSIS — E782 Mixed hyperlipidemia: Secondary | ICD-10-CM | POA: Diagnosis not present

## 2022-03-21 DIAGNOSIS — M25532 Pain in left wrist: Secondary | ICD-10-CM | POA: Diagnosis not present

## 2022-03-21 DIAGNOSIS — R7309 Other abnormal glucose: Secondary | ICD-10-CM | POA: Diagnosis not present

## 2022-03-21 DIAGNOSIS — Z1231 Encounter for screening mammogram for malignant neoplasm of breast: Secondary | ICD-10-CM | POA: Diagnosis not present

## 2022-03-21 DIAGNOSIS — E559 Vitamin D deficiency, unspecified: Secondary | ICD-10-CM | POA: Diagnosis not present

## 2022-03-21 DIAGNOSIS — N183 Chronic kidney disease, stage 3 unspecified: Secondary | ICD-10-CM | POA: Diagnosis not present

## 2022-03-21 DIAGNOSIS — I471 Supraventricular tachycardia, unspecified: Secondary | ICD-10-CM | POA: Diagnosis not present

## 2022-03-21 DIAGNOSIS — Z79899 Other long term (current) drug therapy: Secondary | ICD-10-CM | POA: Diagnosis not present

## 2022-03-21 DIAGNOSIS — I10 Essential (primary) hypertension: Secondary | ICD-10-CM | POA: Diagnosis not present

## 2022-03-21 DIAGNOSIS — E538 Deficiency of other specified B group vitamins: Secondary | ICD-10-CM | POA: Diagnosis not present

## 2022-03-25 ENCOUNTER — Other Ambulatory Visit: Payer: Self-pay | Admitting: Internal Medicine

## 2022-03-25 DIAGNOSIS — Z1231 Encounter for screening mammogram for malignant neoplasm of breast: Secondary | ICD-10-CM

## 2022-03-27 DIAGNOSIS — M19132 Post-traumatic osteoarthritis, left wrist: Secondary | ICD-10-CM | POA: Diagnosis not present

## 2022-04-04 DIAGNOSIS — R7309 Other abnormal glucose: Secondary | ICD-10-CM | POA: Diagnosis not present

## 2022-04-04 DIAGNOSIS — E782 Mixed hyperlipidemia: Secondary | ICD-10-CM | POA: Diagnosis not present

## 2022-04-04 DIAGNOSIS — I10 Essential (primary) hypertension: Secondary | ICD-10-CM | POA: Diagnosis not present

## 2022-04-04 DIAGNOSIS — E039 Hypothyroidism, unspecified: Secondary | ICD-10-CM | POA: Diagnosis not present

## 2022-04-04 DIAGNOSIS — Z79899 Other long term (current) drug therapy: Secondary | ICD-10-CM | POA: Diagnosis not present

## 2022-04-28 DIAGNOSIS — R399 Unspecified symptoms and signs involving the genitourinary system: Secondary | ICD-10-CM | POA: Diagnosis not present

## 2022-05-19 ENCOUNTER — Ambulatory Visit
Admission: RE | Admit: 2022-05-19 | Discharge: 2022-05-19 | Disposition: A | Discharge status: Home / Self Care | Payer: PPO | Source: Ambulatory Visit | Attending: Internal Medicine | Admitting: Internal Medicine

## 2022-05-19 DIAGNOSIS — Z1231 Encounter for screening mammogram for malignant neoplasm of breast: Secondary | ICD-10-CM | POA: Insufficient documentation

## 2022-06-20 DIAGNOSIS — Z79899 Other long term (current) drug therapy: Secondary | ICD-10-CM | POA: Diagnosis not present

## 2022-06-20 DIAGNOSIS — E538 Deficiency of other specified B group vitamins: Secondary | ICD-10-CM | POA: Diagnosis not present

## 2022-06-20 DIAGNOSIS — R7309 Other abnormal glucose: Secondary | ICD-10-CM | POA: Diagnosis not present

## 2022-06-20 DIAGNOSIS — N183 Chronic kidney disease, stage 3 unspecified: Secondary | ICD-10-CM | POA: Diagnosis not present

## 2022-06-20 DIAGNOSIS — E7800 Pure hypercholesterolemia, unspecified: Secondary | ICD-10-CM | POA: Diagnosis not present

## 2022-06-20 DIAGNOSIS — M25531 Pain in right wrist: Secondary | ICD-10-CM | POA: Diagnosis not present

## 2022-06-20 DIAGNOSIS — E039 Hypothyroidism, unspecified: Secondary | ICD-10-CM | POA: Diagnosis not present

## 2022-06-20 DIAGNOSIS — I10 Essential (primary) hypertension: Secondary | ICD-10-CM | POA: Diagnosis not present

## 2022-06-20 DIAGNOSIS — E559 Vitamin D deficiency, unspecified: Secondary | ICD-10-CM | POA: Diagnosis not present

## 2022-06-23 DIAGNOSIS — M3501 Sicca syndrome with keratoconjunctivitis: Secondary | ICD-10-CM | POA: Diagnosis not present

## 2022-06-23 DIAGNOSIS — H43813 Vitreous degeneration, bilateral: Secondary | ICD-10-CM | POA: Diagnosis not present

## 2022-06-25 DIAGNOSIS — M19041 Primary osteoarthritis, right hand: Secondary | ICD-10-CM | POA: Diagnosis not present

## 2022-06-25 DIAGNOSIS — M19031 Primary osteoarthritis, right wrist: Secondary | ICD-10-CM | POA: Diagnosis not present

## 2022-08-21 IMAGING — MG MM DIGITAL SCREENING BILAT W/ TOMO AND CAD
8 series · 8 of 24 positions shown · non-contrast
Comparison: Previous exam(s).

CLINICAL DATA: Screening.

EXAM:
DIGITAL SCREENING BILATERAL MAMMOGRAM WITH TOMOSYNTHESIS AND CAD
TECHNIQUE: Bilateral screening digital craniocaudal and mediolateral oblique
mammograms were obtained. Bilateral screening digital breast
tomosynthesis was performed. The images were evaluated with
computer-aided detection.

[R MLO synth-2D]
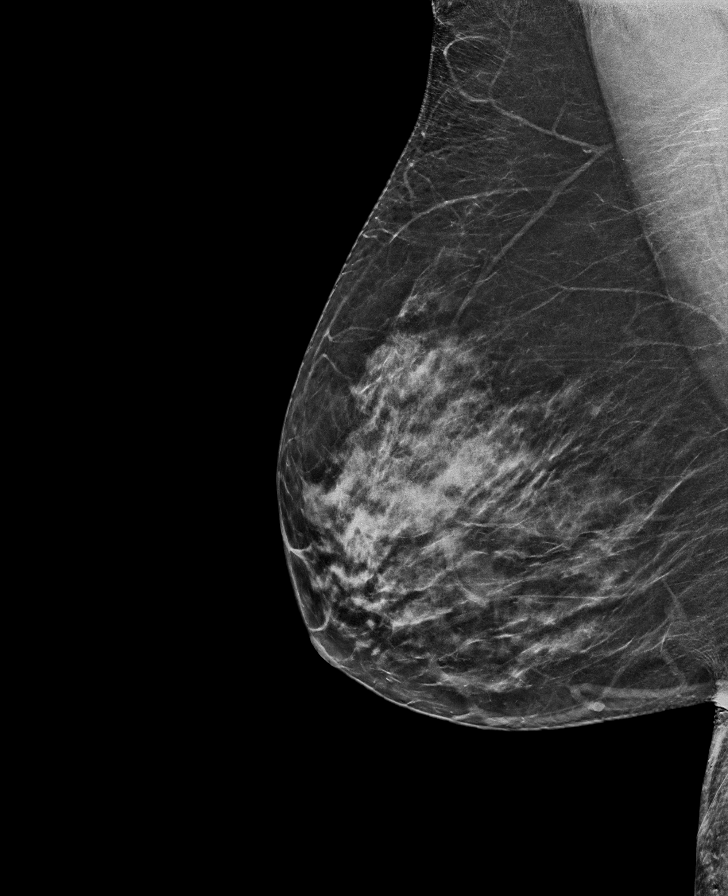

[R CC synth-2D]
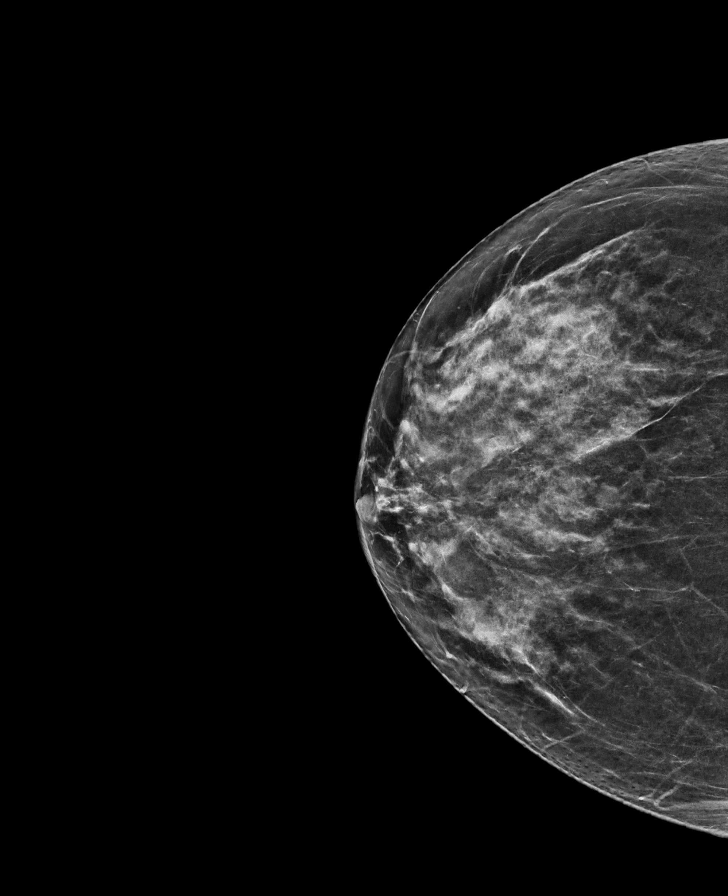

[L CC synth-2D]
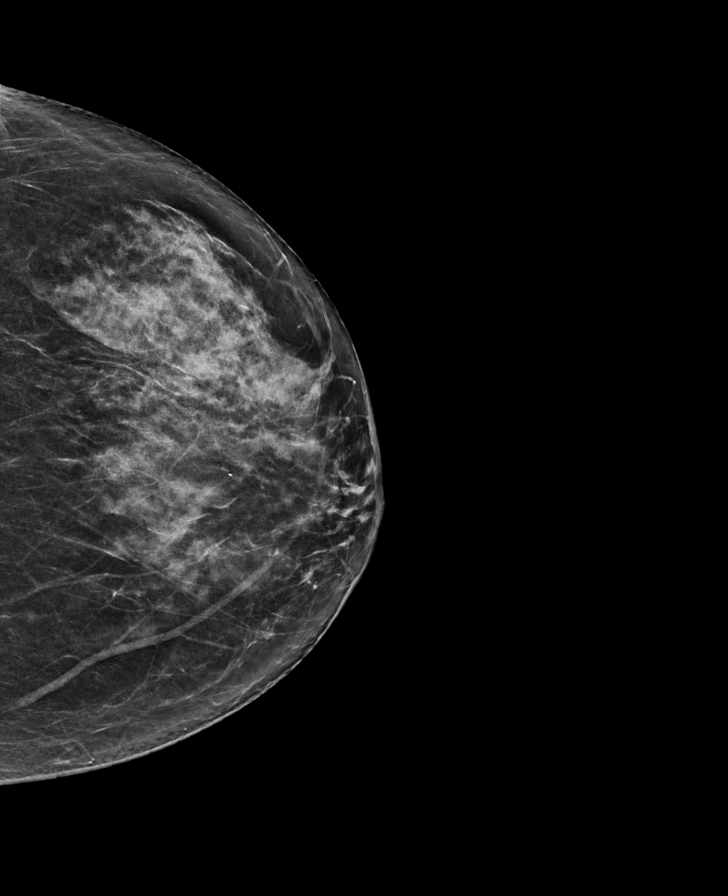

[L MLO synth-2D]
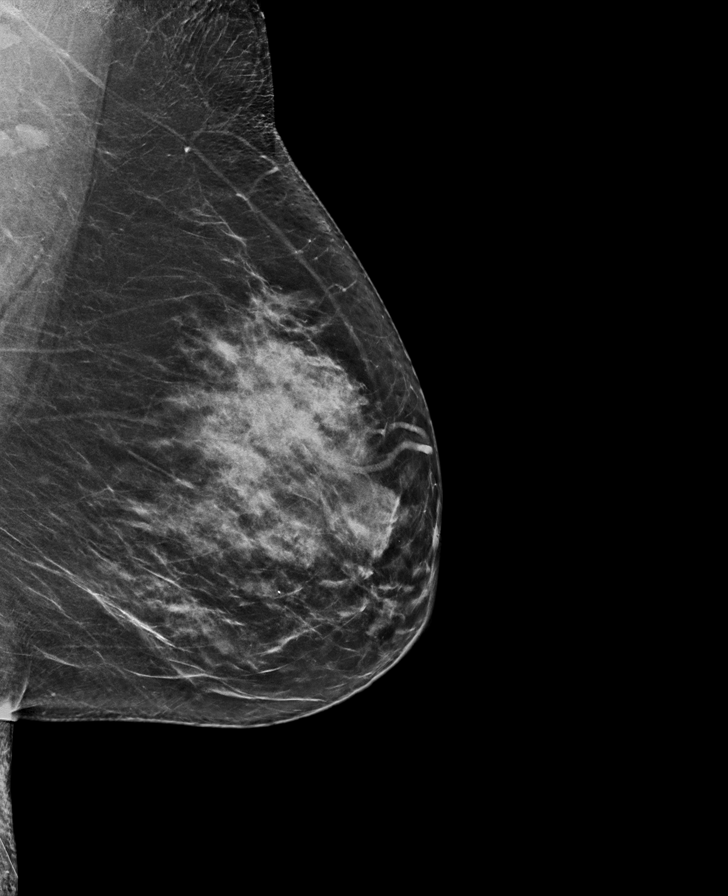

[L CC tomo · tomo slice 33/64.0]
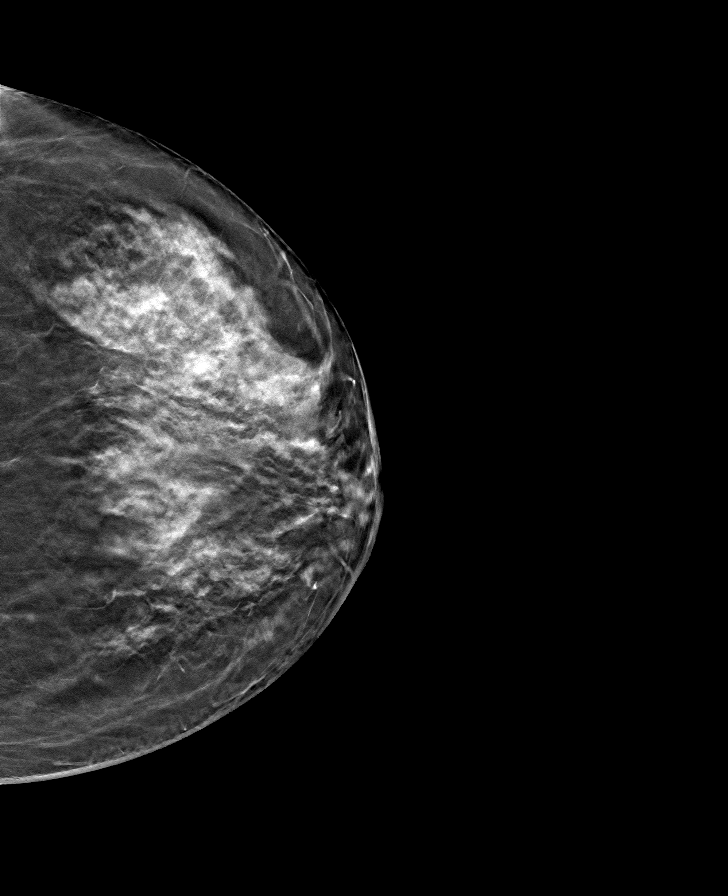

[R MLO tomo · tomo slice 31/62.0]
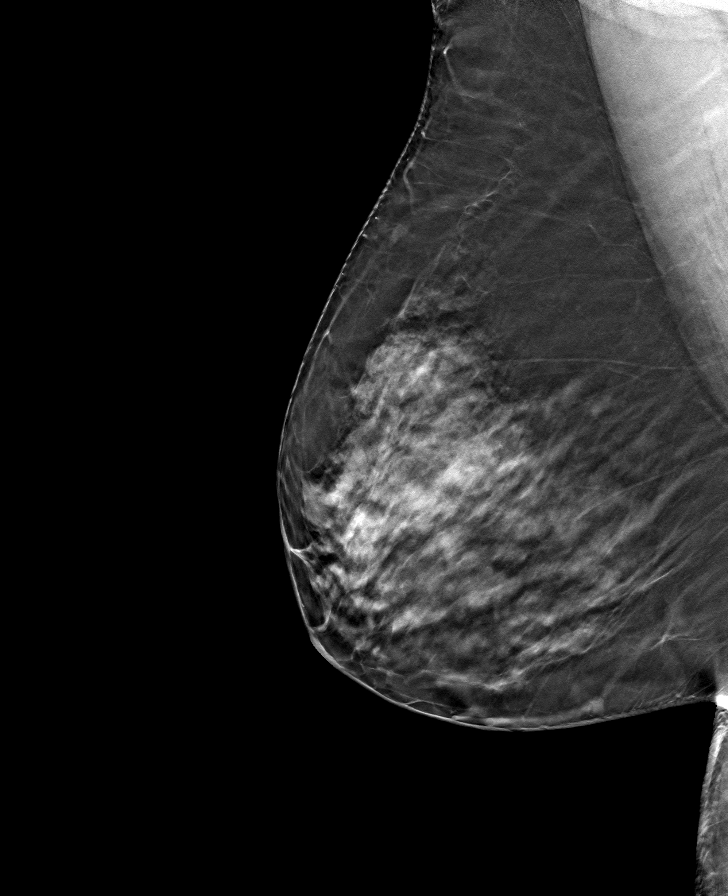

[R CC tomo · tomo slice 31/62.0]
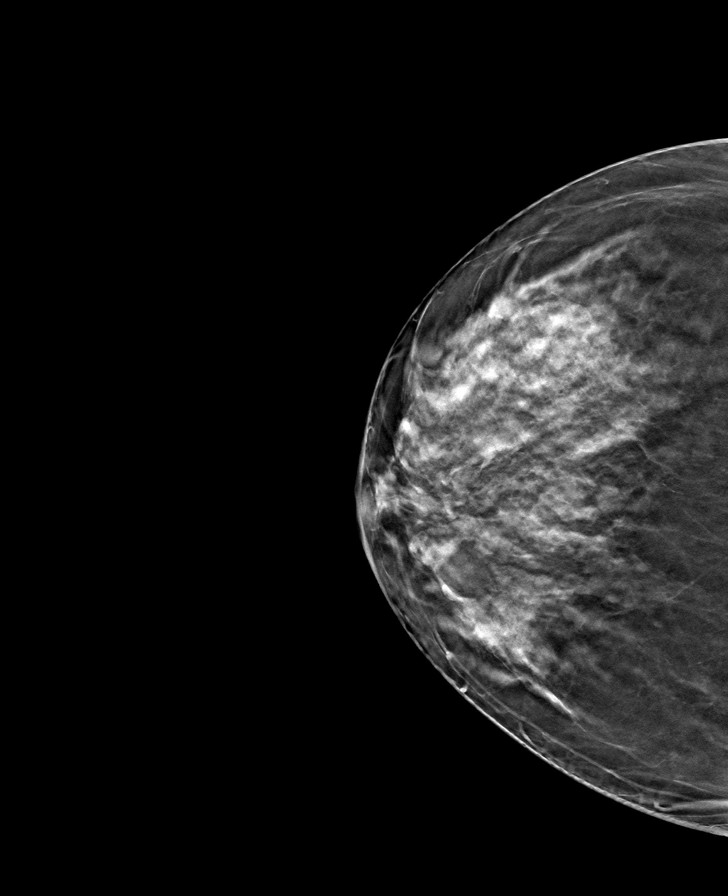

[L MLO tomo · tomo slice 35/68.0]
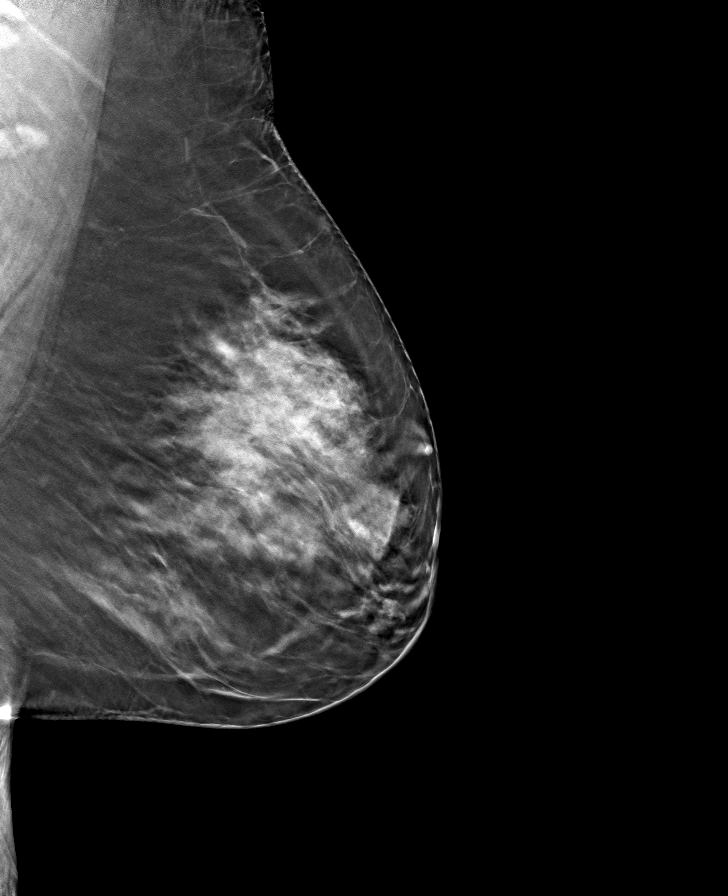

[8 of 24 positions shown; findings below may reference images not displayed]

ACR Breast Density Category c: The breast tissue is heterogeneously
dense, which may obscure small masses.
FINDINGS: There are no findings suspicious for malignancy.
IMPRESSION: No mammographic evidence of malignancy. A result letter of this
screening mammogram will be mailed directly to the patient.

RECOMMENDATION:
Screening mammogram in one year. (Code:Q3-W-BC3)

BI-RADS CATEGORY  1: Negative.

## 2022-09-30 DIAGNOSIS — E538 Deficiency of other specified B group vitamins: Secondary | ICD-10-CM | POA: Diagnosis not present

## 2022-09-30 DIAGNOSIS — E039 Hypothyroidism, unspecified: Secondary | ICD-10-CM | POA: Diagnosis not present

## 2022-09-30 DIAGNOSIS — R7309 Other abnormal glucose: Secondary | ICD-10-CM | POA: Diagnosis not present

## 2022-09-30 DIAGNOSIS — Z79899 Other long term (current) drug therapy: Secondary | ICD-10-CM | POA: Diagnosis not present

## 2022-09-30 DIAGNOSIS — E7800 Pure hypercholesterolemia, unspecified: Secondary | ICD-10-CM | POA: Diagnosis not present

## 2022-09-30 DIAGNOSIS — E559 Vitamin D deficiency, unspecified: Secondary | ICD-10-CM | POA: Diagnosis not present

## 2022-09-30 DIAGNOSIS — I10 Essential (primary) hypertension: Secondary | ICD-10-CM | POA: Diagnosis not present

## 2022-10-07 DIAGNOSIS — R7309 Other abnormal glucose: Secondary | ICD-10-CM | POA: Diagnosis not present

## 2022-10-07 DIAGNOSIS — E7800 Pure hypercholesterolemia, unspecified: Secondary | ICD-10-CM | POA: Diagnosis not present

## 2022-10-07 DIAGNOSIS — I10 Essential (primary) hypertension: Secondary | ICD-10-CM | POA: Diagnosis not present

## 2022-10-07 DIAGNOSIS — R7303 Prediabetes: Secondary | ICD-10-CM | POA: Diagnosis not present

## 2022-10-07 DIAGNOSIS — N183 Chronic kidney disease, stage 3 unspecified: Secondary | ICD-10-CM | POA: Diagnosis not present

## 2022-10-07 DIAGNOSIS — Z0000 Encounter for general adult medical examination without abnormal findings: Secondary | ICD-10-CM | POA: Diagnosis not present

## 2022-10-07 DIAGNOSIS — I471 Supraventricular tachycardia, unspecified: Secondary | ICD-10-CM | POA: Diagnosis not present

## 2022-10-07 DIAGNOSIS — E039 Hypothyroidism, unspecified: Secondary | ICD-10-CM | POA: Diagnosis not present

## 2022-10-07 DIAGNOSIS — Z1211 Encounter for screening for malignant neoplasm of colon: Secondary | ICD-10-CM | POA: Diagnosis not present

## 2022-10-07 DIAGNOSIS — E559 Vitamin D deficiency, unspecified: Secondary | ICD-10-CM | POA: Diagnosis not present

## 2022-10-07 DIAGNOSIS — Z79899 Other long term (current) drug therapy: Secondary | ICD-10-CM | POA: Diagnosis not present

## 2022-10-07 DIAGNOSIS — E538 Deficiency of other specified B group vitamins: Secondary | ICD-10-CM | POA: Diagnosis not present

## 2022-10-13 DIAGNOSIS — Z1211 Encounter for screening for malignant neoplasm of colon: Secondary | ICD-10-CM | POA: Diagnosis not present

## 2023-01-01 DIAGNOSIS — Z79899 Other long term (current) drug therapy: Secondary | ICD-10-CM | POA: Diagnosis not present

## 2023-01-01 DIAGNOSIS — E7800 Pure hypercholesterolemia, unspecified: Secondary | ICD-10-CM | POA: Diagnosis not present

## 2023-01-01 DIAGNOSIS — E039 Hypothyroidism, unspecified: Secondary | ICD-10-CM | POA: Diagnosis not present

## 2023-01-08 DIAGNOSIS — Z0000 Encounter for general adult medical examination without abnormal findings: Secondary | ICD-10-CM | POA: Diagnosis not present

## 2023-01-08 DIAGNOSIS — I10 Essential (primary) hypertension: Secondary | ICD-10-CM | POA: Diagnosis not present

## 2023-01-08 DIAGNOSIS — E538 Deficiency of other specified B group vitamins: Secondary | ICD-10-CM | POA: Diagnosis not present

## 2023-01-08 DIAGNOSIS — R7303 Prediabetes: Secondary | ICD-10-CM | POA: Diagnosis not present

## 2023-01-08 DIAGNOSIS — E039 Hypothyroidism, unspecified: Secondary | ICD-10-CM | POA: Diagnosis not present

## 2023-01-08 DIAGNOSIS — N183 Chronic kidney disease, stage 3 unspecified: Secondary | ICD-10-CM | POA: Diagnosis not present

## 2023-01-08 DIAGNOSIS — Z79899 Other long term (current) drug therapy: Secondary | ICD-10-CM | POA: Diagnosis not present

## 2023-01-08 DIAGNOSIS — E782 Mixed hyperlipidemia: Secondary | ICD-10-CM | POA: Diagnosis not present

## 2023-01-08 DIAGNOSIS — E559 Vitamin D deficiency, unspecified: Secondary | ICD-10-CM | POA: Diagnosis not present

## 2023-04-06 DIAGNOSIS — Z79899 Other long term (current) drug therapy: Secondary | ICD-10-CM | POA: Diagnosis not present

## 2023-04-06 DIAGNOSIS — E559 Vitamin D deficiency, unspecified: Secondary | ICD-10-CM | POA: Diagnosis not present

## 2023-04-06 DIAGNOSIS — E039 Hypothyroidism, unspecified: Secondary | ICD-10-CM | POA: Diagnosis not present

## 2023-04-06 DIAGNOSIS — E782 Mixed hyperlipidemia: Secondary | ICD-10-CM | POA: Diagnosis not present

## 2023-04-06 DIAGNOSIS — R7303 Prediabetes: Secondary | ICD-10-CM | POA: Diagnosis not present

## 2023-04-06 DIAGNOSIS — I10 Essential (primary) hypertension: Secondary | ICD-10-CM | POA: Diagnosis not present

## 2023-04-13 ENCOUNTER — Other Ambulatory Visit: Payer: Self-pay | Admitting: Internal Medicine

## 2023-04-13 DIAGNOSIS — E7800 Pure hypercholesterolemia, unspecified: Secondary | ICD-10-CM | POA: Diagnosis not present

## 2023-04-13 DIAGNOSIS — E538 Deficiency of other specified B group vitamins: Secondary | ICD-10-CM | POA: Diagnosis not present

## 2023-04-13 DIAGNOSIS — R7303 Prediabetes: Secondary | ICD-10-CM | POA: Diagnosis not present

## 2023-04-13 DIAGNOSIS — Z1231 Encounter for screening mammogram for malignant neoplasm of breast: Secondary | ICD-10-CM

## 2023-04-13 DIAGNOSIS — N183 Chronic kidney disease, stage 3 unspecified: Secondary | ICD-10-CM | POA: Diagnosis not present

## 2023-04-13 DIAGNOSIS — E559 Vitamin D deficiency, unspecified: Secondary | ICD-10-CM | POA: Diagnosis not present

## 2023-04-13 DIAGNOSIS — E039 Hypothyroidism, unspecified: Secondary | ICD-10-CM | POA: Diagnosis not present

## 2023-04-13 DIAGNOSIS — I10 Essential (primary) hypertension: Secondary | ICD-10-CM | POA: Diagnosis not present

## 2023-05-21 ENCOUNTER — Ambulatory Visit
Admission: RE | Admit: 2023-05-21 | Discharge: 2023-05-21 | Disposition: A | Discharge status: Home / Self Care | Payer: PPO | Source: Ambulatory Visit | Attending: Internal Medicine | Admitting: Internal Medicine

## 2023-05-21 DIAGNOSIS — Z1231 Encounter for screening mammogram for malignant neoplasm of breast: Secondary | ICD-10-CM | POA: Insufficient documentation

## 2023-06-12 DIAGNOSIS — N3001 Acute cystitis with hematuria: Secondary | ICD-10-CM | POA: Diagnosis not present

## 2023-06-12 DIAGNOSIS — J069 Acute upper respiratory infection, unspecified: Secondary | ICD-10-CM | POA: Diagnosis not present

## 2023-06-12 DIAGNOSIS — R300 Dysuria: Secondary | ICD-10-CM | POA: Diagnosis not present

## 2023-06-12 DIAGNOSIS — R0989 Other specified symptoms and signs involving the circulatory and respiratory systems: Secondary | ICD-10-CM | POA: Diagnosis not present

## 2023-07-14 DIAGNOSIS — E782 Mixed hyperlipidemia: Secondary | ICD-10-CM | POA: Diagnosis not present

## 2023-07-14 DIAGNOSIS — E039 Hypothyroidism, unspecified: Secondary | ICD-10-CM | POA: Diagnosis not present

## 2023-07-14 DIAGNOSIS — Z0000 Encounter for general adult medical examination without abnormal findings: Secondary | ICD-10-CM | POA: Diagnosis not present

## 2023-07-14 DIAGNOSIS — E538 Deficiency of other specified B group vitamins: Secondary | ICD-10-CM | POA: Diagnosis not present

## 2023-07-14 DIAGNOSIS — Z79899 Other long term (current) drug therapy: Secondary | ICD-10-CM | POA: Diagnosis not present

## 2023-07-14 DIAGNOSIS — R7303 Prediabetes: Secondary | ICD-10-CM | POA: Diagnosis not present

## 2023-07-14 DIAGNOSIS — N183 Chronic kidney disease, stage 3 unspecified: Secondary | ICD-10-CM | POA: Diagnosis not present

## 2023-07-14 DIAGNOSIS — I10 Essential (primary) hypertension: Secondary | ICD-10-CM | POA: Diagnosis not present

## 2023-07-14 DIAGNOSIS — E559 Vitamin D deficiency, unspecified: Secondary | ICD-10-CM | POA: Diagnosis not present

## 2023-09-10 DIAGNOSIS — N3001 Acute cystitis with hematuria: Secondary | ICD-10-CM | POA: Diagnosis not present

## 2023-11-05 DIAGNOSIS — E782 Mixed hyperlipidemia: Secondary | ICD-10-CM | POA: Diagnosis not present

## 2023-11-05 DIAGNOSIS — E039 Hypothyroidism, unspecified: Secondary | ICD-10-CM | POA: Diagnosis not present

## 2023-11-05 DIAGNOSIS — R519 Headache, unspecified: Secondary | ICD-10-CM | POA: Diagnosis not present

## 2023-11-05 DIAGNOSIS — R2689 Other abnormalities of gait and mobility: Secondary | ICD-10-CM | POA: Diagnosis not present

## 2023-11-05 DIAGNOSIS — E538 Deficiency of other specified B group vitamins: Secondary | ICD-10-CM | POA: Diagnosis not present

## 2023-11-05 DIAGNOSIS — Z79899 Other long term (current) drug therapy: Secondary | ICD-10-CM | POA: Diagnosis not present

## 2023-11-05 DIAGNOSIS — I471 Supraventricular tachycardia, unspecified: Secondary | ICD-10-CM | POA: Diagnosis not present

## 2023-11-05 DIAGNOSIS — Z0000 Encounter for general adult medical examination without abnormal findings: Secondary | ICD-10-CM | POA: Diagnosis not present

## 2023-11-05 DIAGNOSIS — N183 Chronic kidney disease, stage 3 unspecified: Secondary | ICD-10-CM | POA: Diagnosis not present

## 2023-11-05 DIAGNOSIS — R7303 Prediabetes: Secondary | ICD-10-CM | POA: Diagnosis not present

## 2023-11-05 DIAGNOSIS — Z1331 Encounter for screening for depression: Secondary | ICD-10-CM | POA: Diagnosis not present

## 2023-11-05 DIAGNOSIS — E559 Vitamin D deficiency, unspecified: Secondary | ICD-10-CM | POA: Diagnosis not present

## 2023-11-05 DIAGNOSIS — I10 Essential (primary) hypertension: Secondary | ICD-10-CM | POA: Diagnosis not present

## 2023-11-06 ENCOUNTER — Other Ambulatory Visit: Payer: Self-pay | Admitting: Internal Medicine

## 2023-11-06 DIAGNOSIS — Z0000 Encounter for general adult medical examination without abnormal findings: Secondary | ICD-10-CM

## 2023-11-06 DIAGNOSIS — R2689 Other abnormalities of gait and mobility: Secondary | ICD-10-CM

## 2023-11-06 DIAGNOSIS — R519 Headache, unspecified: Secondary | ICD-10-CM

## 2023-11-09 ENCOUNTER — Ambulatory Visit
Admission: RE | Admit: 2023-11-09 | Discharge: 2023-11-09 | Disposition: A | Discharge status: Home / Self Care | Source: Ambulatory Visit | Attending: Internal Medicine | Admitting: Internal Medicine

## 2023-11-09 DIAGNOSIS — R2689 Other abnormalities of gait and mobility: Secondary | ICD-10-CM | POA: Diagnosis not present

## 2023-11-09 DIAGNOSIS — Z0000 Encounter for general adult medical examination without abnormal findings: Secondary | ICD-10-CM | POA: Diagnosis not present

## 2023-11-09 DIAGNOSIS — R519 Headache, unspecified: Secondary | ICD-10-CM | POA: Insufficient documentation

## 2023-11-09 DIAGNOSIS — Z1211 Encounter for screening for malignant neoplasm of colon: Secondary | ICD-10-CM | POA: Diagnosis not present

## 2023-11-10 DIAGNOSIS — D485 Neoplasm of uncertain behavior of skin: Secondary | ICD-10-CM | POA: Diagnosis not present

## 2023-11-10 DIAGNOSIS — L2989 Other pruritus: Secondary | ICD-10-CM | POA: Diagnosis not present

## 2023-11-10 DIAGNOSIS — L821 Other seborrheic keratosis: Secondary | ICD-10-CM | POA: Diagnosis not present

## 2023-11-10 DIAGNOSIS — D2261 Melanocytic nevi of right upper limb, including shoulder: Secondary | ICD-10-CM | POA: Diagnosis not present

## 2023-11-10 DIAGNOSIS — D2272 Melanocytic nevi of left lower limb, including hip: Secondary | ICD-10-CM | POA: Diagnosis not present

## 2023-11-10 DIAGNOSIS — D2271 Melanocytic nevi of right lower limb, including hip: Secondary | ICD-10-CM | POA: Diagnosis not present

## 2023-11-10 DIAGNOSIS — D2372 Other benign neoplasm of skin of left lower limb, including hip: Secondary | ICD-10-CM | POA: Diagnosis not present

## 2023-11-10 DIAGNOSIS — D0372 Melanoma in situ of left lower limb, including hip: Secondary | ICD-10-CM | POA: Diagnosis not present

## 2023-11-10 DIAGNOSIS — D225 Melanocytic nevi of trunk: Secondary | ICD-10-CM | POA: Diagnosis not present

## 2023-11-10 DIAGNOSIS — D2262 Melanocytic nevi of left upper limb, including shoulder: Secondary | ICD-10-CM | POA: Diagnosis not present

## 2023-11-10 DIAGNOSIS — D2362 Other benign neoplasm of skin of left upper limb, including shoulder: Secondary | ICD-10-CM | POA: Diagnosis not present

## 2023-11-10 DIAGNOSIS — L820 Inflamed seborrheic keratosis: Secondary | ICD-10-CM | POA: Diagnosis not present

## 2023-12-16 DIAGNOSIS — L905 Scar conditions and fibrosis of skin: Secondary | ICD-10-CM | POA: Diagnosis not present

## 2023-12-16 DIAGNOSIS — D0372 Melanoma in situ of left lower limb, including hip: Secondary | ICD-10-CM | POA: Diagnosis not present

## 2024-02-05 DIAGNOSIS — R7303 Prediabetes: Secondary | ICD-10-CM | POA: Diagnosis not present

## 2024-02-05 DIAGNOSIS — E559 Vitamin D deficiency, unspecified: Secondary | ICD-10-CM | POA: Diagnosis not present

## 2024-02-05 DIAGNOSIS — E782 Mixed hyperlipidemia: Secondary | ICD-10-CM | POA: Diagnosis not present

## 2024-02-05 DIAGNOSIS — E538 Deficiency of other specified B group vitamins: Secondary | ICD-10-CM | POA: Diagnosis not present

## 2024-02-05 DIAGNOSIS — N183 Chronic kidney disease, stage 3 unspecified: Secondary | ICD-10-CM | POA: Diagnosis not present

## 2024-02-05 DIAGNOSIS — E039 Hypothyroidism, unspecified: Secondary | ICD-10-CM | POA: Diagnosis not present

## 2024-02-05 DIAGNOSIS — Z79899 Other long term (current) drug therapy: Secondary | ICD-10-CM | POA: Diagnosis not present

## 2024-02-05 DIAGNOSIS — I10 Essential (primary) hypertension: Secondary | ICD-10-CM | POA: Diagnosis not present

## 2024-04-12 ENCOUNTER — Other Ambulatory Visit: Payer: Self-pay | Admitting: Internal Medicine

## 2024-04-12 DIAGNOSIS — Z1231 Encounter for screening mammogram for malignant neoplasm of breast: Secondary | ICD-10-CM

## 2024-05-12 DIAGNOSIS — E559 Vitamin D deficiency, unspecified: Secondary | ICD-10-CM | POA: Diagnosis not present

## 2024-05-16 DIAGNOSIS — D225 Melanocytic nevi of trunk: Secondary | ICD-10-CM | POA: Diagnosis not present

## 2024-05-16 DIAGNOSIS — R208 Other disturbances of skin sensation: Secondary | ICD-10-CM | POA: Diagnosis not present

## 2024-05-16 DIAGNOSIS — L820 Inflamed seborrheic keratosis: Secondary | ICD-10-CM | POA: Diagnosis not present

## 2024-05-16 DIAGNOSIS — D2262 Melanocytic nevi of left upper limb, including shoulder: Secondary | ICD-10-CM | POA: Diagnosis not present

## 2024-05-16 DIAGNOSIS — Z86006 Personal history of melanoma in-situ: Secondary | ICD-10-CM | POA: Diagnosis not present

## 2024-05-16 DIAGNOSIS — D2261 Melanocytic nevi of right upper limb, including shoulder: Secondary | ICD-10-CM | POA: Diagnosis not present

## 2024-05-16 DIAGNOSIS — D2272 Melanocytic nevi of left lower limb, including hip: Secondary | ICD-10-CM | POA: Diagnosis not present

## 2024-05-16 DIAGNOSIS — D485 Neoplasm of uncertain behavior of skin: Secondary | ICD-10-CM | POA: Diagnosis not present

## 2024-05-16 DIAGNOSIS — Z85820 Personal history of malignant melanoma of skin: Secondary | ICD-10-CM | POA: Diagnosis not present

## 2024-05-23 ENCOUNTER — Inpatient Hospital Stay: Admission: RE | Admit: 2024-05-23 | Discharge: 2024-05-23 | Attending: Internal Medicine | Admitting: Internal Medicine

## 2024-05-23 DIAGNOSIS — Z1231 Encounter for screening mammogram for malignant neoplasm of breast: Secondary | ICD-10-CM | POA: Diagnosis present
